# Patient Record
Sex: Male | Born: 1960 | Hispanic: No | Marital: Married | Smoking: Never smoker
Health system: Southern US, Community
[De-identification: ages and names within clinical notes are randomized; demographics above are authoritative.]

## PROBLEM LIST (undated history)

## (undated) DIAGNOSIS — C801 Malignant (primary) neoplasm, unspecified: Secondary | ICD-10-CM

## (undated) HISTORY — DX: Malignant (primary) neoplasm, unspecified: C80.1

## (undated) HISTORY — PX: OTHER SURGICAL HISTORY: SHX169

---

## 2011-12-02 ENCOUNTER — Other Ambulatory Visit: Payer: Self-pay | Admitting: Otolaryngology

## 2011-12-02 DIAGNOSIS — C310 Malignant neoplasm of maxillary sinus: Secondary | ICD-10-CM

## 2011-12-02 DIAGNOSIS — C050 Malignant neoplasm of hard palate: Secondary | ICD-10-CM

## 2011-12-04 ENCOUNTER — Ambulatory Visit
Admission: RE | Admit: 2011-12-04 | Discharge: 2011-12-04 | Disposition: A | Discharge status: Home / Self Care | Payer: Self-pay | Source: Ambulatory Visit | Attending: Otolaryngology | Admitting: Otolaryngology

## 2011-12-04 DIAGNOSIS — C050 Malignant neoplasm of hard palate: Secondary | ICD-10-CM

## 2011-12-04 DIAGNOSIS — C310 Malignant neoplasm of maxillary sinus: Secondary | ICD-10-CM

## 2011-12-04 MED ORDER — IOHEXOL 300 MG/ML  SOLN
75.0000 mL | Freq: Once | INTRAMUSCULAR | Status: AC | PRN
  Administered 2011-12-04: 75 mL via INTRAVENOUS

## 2014-05-18 ENCOUNTER — Encounter: Payer: Self-pay | Admitting: Vascular Surgery

## 2014-05-18 ENCOUNTER — Other Ambulatory Visit: Payer: Self-pay | Admitting: *Deleted

## 2014-05-18 ENCOUNTER — Ambulatory Visit (INDEPENDENT_AMBULATORY_CARE_PROVIDER_SITE_OTHER): Payer: Medicaid Other | Admitting: Vascular Surgery

## 2014-05-18 ENCOUNTER — Encounter (HOSPITAL_COMMUNITY): Payer: Medicaid Other

## 2014-05-18 ENCOUNTER — Ambulatory Visit (HOSPITAL_COMMUNITY)
Admission: RE | Admit: 2014-05-18 | Discharge: 2014-05-18 | Disposition: A | Discharge status: Home / Self Care | Payer: Medicaid Other | Source: Ambulatory Visit | Attending: Vascular Surgery | Admitting: Vascular Surgery

## 2014-05-18 VITALS — BP 131/90 | HR 79 | Ht 66.0 in | Wt 143.5 lb

## 2014-05-18 DIAGNOSIS — I779 Disorder of arteries and arterioles, unspecified: Secondary | ICD-10-CM

## 2014-05-18 DIAGNOSIS — M79676 Pain in unspecified toe(s): Secondary | ICD-10-CM | POA: Insufficient documentation

## 2014-05-18 DIAGNOSIS — I70209 Unspecified atherosclerosis of native arteries of extremities, unspecified extremity: Secondary | ICD-10-CM

## 2014-05-18 DIAGNOSIS — I872 Venous insufficiency (chronic) (peripheral): Secondary | ICD-10-CM

## 2014-05-18 NOTE — Progress Notes (Signed)
Referred by:  Harvie Junior, MD [place], [place] [id number]  Reason for referral: B toe ischemia  History of Present Illness  Vincent Heath is a 54 y.o. (December 11, 1960) male who presents with chief complaint: B toe pain with color change.  Pt notes ~1 month ago, onset of toe color change to purple and pain in his toes.  No obvious trigger.  Temperature has not been found to influence the color but elevating the foot resolves the color.  The patient has no history of intermittent claudication or rest pain.  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  History is obtained through a Optometrist.  The patient does not note any cardiac arrhythmia.   Medical History: oral cancer  Surgical History: orofacial surgery for oral cancer  History   Social History  . Marital Status: Married    Spouse Name: N/A    Number of Children: N/A  . Years of Education: N/A   Occupational History  . Not on file.   Social History Main Topics  . Smoking status: Never Smoker   . Smokeless tobacco: Current User    Types: Chew  . Alcohol Use: 0.0 oz/week    0 Not specified per week     Comment: occ  . Drug Use: No  . Sexual Activity: Not on file   Other Topics Concern  . Not on file   Social History Narrative  . No narrative on file    Family history: patient does not recall his parents' medical history   Current Outpatient Prescriptions  Medication Sig Dispense Refill  . Multiple Vitamins-Minerals (CENTRUM SILVER PO) Take 1 capsule by mouth daily.     No current facility-administered medications for this visit.     No Known Allergies   REVIEW OF SYSTEMS:  (Positives checked otherwise negative)  CARDIOVASCULAR:  []  chest pain, []  chest pressure, []  palpitations, []  shortness of breath when laying flat, []  shortness of breath with exertion,  []  pain in feet when walking, [x]  pain in feet when laying flat, []  history of blood  clot in veins (DVT), []  history of phlebitis, []  swelling in legs, []  varicose veins  PULMONARY:  []  productive cough, []  asthma, []  wheezing  NEUROLOGIC:  []  weakness in arms or legs, []  numbness in arms or legs, []  difficulty speaking or slurred speech, []  temporary loss of vision in one eye, []  dizziness  HEMATOLOGIC:  []  bleeding problems, []  problems with blood clotting too easily  MUSCULOSKEL:  []  joint pain, []  joint swelling  GASTROINTEST:  []  vomiting blood, []  blood in stool     GENITOURINARY:  []  burning with urination, []  blood in urine  PSYCHIATRIC:  []  history of major depression  INTEGUMENTARY:  []  rashes, []  ulcers  CONSTITUTIONAL:  []  fever, []  chills   For VQI Use Only  PRE-ADM LIVING: Home  AMB STATUS: Ambulatory  CAD Sx: None  PRIOR CHF: None  STRESS TEST: [ ]  No, [ ]  Normal, [ ]  + ischemia, [ ]  + MI, [ ]  Both   Physical Examination  Filed Vitals:   05/18/14 1117  BP: 131/90  Pulse: 79  Height: 5\' 6"  (1.676 m)  Weight: 143 lb 8 oz (65.091 kg)  SpO2: 100%    Body mass index is 23.17 kg/(m^2).  General: A&O x 3, WDWN,   Head: [place]/AT, obvious facial malformation  Ear/Nose/Throat: Hearing grossly intact, nares w/o erythema  or drainage, oropharynx w/o Erythema/Exudate, Mallampati score: 2   Eyes: PERRLA, EOMI  Neck: Supple, no nuchal rigidity, no palpable LAD  Pulmonary: Sym exp, good air movt, CTAB, no rales, rhonchi, & wheezing  Cardiac: RRR, Nl S1, S2, no Murmurs, rubs or gallops  Vascular: Vessel Right Left  Radial Palpable Palpable  Ulnar Palpable Palpable  Brachial Palpable Palpable  Carotid Palpable, without bruit Palpable, without bruit  Aorta Not palpable N/A  Femoral Palpable Palpable  Popliteal Not palpable Not palpable  PT  Palpable  Palpable  DP  Palpable  Palpable   Gastrointestinal: soft, NTND, -G/R, - HSM, - masses, - CVAT B  Musculoskeletal: M/S 5/5 throughout , Extremities without ischemic changes , cyanotic  toes with L 1st and 2nd toe and R 1st toe most cyanotic, no frank gangrene, no speckling   Neurologic: CN 2-12 intact , Pain and light touch intact in extremities , Motor exam as listed above  Psychiatric: Judgment intact, Mood & affect appropriate for pt's clinical situation  Dermatologic: See M/S exam for extremity exam, no rashes otherwise noted  Lymph : No Cervical, Axillary, or Inguinal lymphadenopathy    Non-Invasive Vascular Imaging  ABI (Date: 05/18/2014)  RLE: 1.31, PT: tri, DP: tri, toe waveforms greatly dampened  LLE: 1.27, PT: tri, DP: tri, toe waveforms greatly dampened  Outside Studies/Documentation 3 pages of outside documents were reviewed including: public health clinic chart.  Medical Decision Making  Vincent Heath is a 54 y.o. 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   Pt sx and exam are not consistent with Raynaud's.  His exam also is not c/w thromboembolism.  Realistically after a month, anticoagulation would be useless if this was in fact distal thromboembolism.  The history of cyanosis resolution with elevation is consistent with CVI.    I ordered a BLE venous reflux exam.  The patient end up leaving before we could speak further.  I suspect he is going to need bilateral angiography to full evaluate his disease.    Unfortunately, I'm not certain that anything is going to help in this case, as there is no surgical or endovascular therapy for digital artery occlusive disease.  Thank you for allowing [place] to participate in this patient's care.  Adele Barthel, MD [facility] [place] Office: [phone] Pager: [phone]  05/18/2014, 2:53 PM

## 2014-05-18 NOTE — Addendum Note (Signed)
Addended by: Mena Goes on: 05/18/2014 03:41 PM   Modules accepted: Orders

## 2014-05-23 ENCOUNTER — Ambulatory Visit (HOSPITAL_COMMUNITY)
Admission: RE | Admit: 2014-05-23 | Discharge: 2014-05-23 | Disposition: A | Discharge status: Home / Self Care | Payer: Medicaid Other | Source: Ambulatory Visit | Attending: Vascular Surgery | Admitting: Vascular Surgery

## 2014-05-23 ENCOUNTER — Encounter: Payer: Self-pay | Admitting: Vascular Surgery

## 2014-05-23 ENCOUNTER — Ambulatory Visit (INDEPENDENT_AMBULATORY_CARE_PROVIDER_SITE_OTHER): Payer: Medicaid Other | Admitting: Vascular Surgery

## 2014-05-23 ENCOUNTER — Encounter (HOSPITAL_COMMUNITY): Payer: Medicaid Other

## 2014-05-23 VITALS — BP 135/93 | HR 80 | Resp 14 | Ht 66.0 in | Wt 143.0 lb

## 2014-05-23 DIAGNOSIS — I70209 Unspecified atherosclerosis of native arteries of extremities, unspecified extremity: Secondary | ICD-10-CM | POA: Diagnosis not present

## 2014-05-23 DIAGNOSIS — I872 Venous insufficiency (chronic) (peripheral): Secondary | ICD-10-CM

## 2014-05-23 DIAGNOSIS — I75029 Atheroembolism of unspecified lower extremity: Secondary | ICD-10-CM

## 2014-05-23 DIAGNOSIS — I779 Disorder of arteries and arterioles, unspecified: Secondary | ICD-10-CM

## 2014-05-23 NOTE — Progress Notes (Signed)
    Established Critical Limb Ischemia Patient  History of Present Illness  Vincent Heath is a 54 y.o. (Sep 23, 1960) male who presents with chief complaint: blue toes with L foot pain.  The patient does not have rest pain.  He continues to have a burning sensation in his L foot with color changes dependent on position.  In dependent position, the toes are blue when elevated they are "red."  Pt returns today for his venous insufficiency duplex.  The patient's PMH, PSH, SH, FamHx, Med, and Allergies are unchanged from 05/18/13.  On ROS today: no rest pain, no claudication, neuropathic sx in L foot  Physical Examination  Filed Vitals:   05/23/14 1521  BP: 135/93  Pulse: 80  Resp: 14  Height: 5\' 6"  (1.676 m)  Weight: 143 lb (64.864 kg)   Body mass index is 23.09 kg/(m^2).  General: A&O x 3, WDWN  Eyes: PERRLA, EOMI  Pulmonary: Sym exp, good air movt, CTAB, no rales, rhonchi, & wheezing  Cardiac: RRR, Nl S1, S2, no Murmurs, rubs or gallops  Vascular: Vessel Right Left  Radial Palpable Palpable  Brachial Palpable **Palpable  Carotid Palpable, without bruit Palpable, without bruit  Aorta Not palpable N/A  Femoral Palpable Palpable  Popliteal Not palpable Not palpable  PT Palpable Palpable  DP Palpable Palpable   Gastrointestinal: soft, NTND, -G/R, - HSM, - masses, - CVAT B  Musculoskeletal: M/S 5/5 throughout , Extremities without ischemic changes except  Cyanotic toes (L 1-2 worst), strongly palpable dorsal arch pulse  Neurologic: Pain and light touch intact in extremities , Motor exam as listed above  Non-Invasive Vascular Imaging  BLE venous insufficiency duplex (05/23/2014)   No DVT or SVT B, no GSV reflux B, +CFV reflux bilaterally  Medical Decision Making  Vincent Heath is a 54 y.o. male who presents with: B digital artery occlusive disease, CVI (C2)  I doubt this pt's CVI accounts for his sx.   Unfortunately, he doesn't fit the classic presentation of  Raynaud or thromboembolism. As I have discussed previously, anticoagulation is unlikely to be beneficial >1 month from start of his sx. I would screen the aorta for an embolic source: CTA chest, CTA abd/pelvis with runoff. I am also ordering: BMP, CBC, RF, ANA to screen for rheumatolgic etiologies. If those are not diagnostic, BLE runoff may be needed to evaluate his B foot perfusion. The patient will be started on ASA 81 mg PO daily. I offered him low dose Norvasc but he declined as reportedly he normally has a normal BP. He will follow up in 2 weeks with these studies.  Thank you for allowing [place] to participate in this patient's care.  Adele Barthel, MD [facility] [place] Office: [phone] Pager: [phone]  05/23/2014, 4:58 PM

## 2014-05-24 NOTE — Addendum Note (Signed)
Addended by: Mena Goes on: 05/24/2014 01:54 PM   Modules accepted: Orders

## 2014-05-25 ENCOUNTER — Other Ambulatory Visit: Payer: Self-pay | Admitting: Vascular Surgery

## 2014-05-25 LAB — BUN: BUN: 11 mg/dL (ref 6–23)

## 2014-05-25 LAB — CREATININE, SERUM: Creat: 0.89 mg/dL (ref 0.50–1.35)

## 2014-05-31 ENCOUNTER — Inpatient Hospital Stay: Admission: RE | Admit: 2014-05-31 | Payer: Medicaid Other | Source: Ambulatory Visit

## 2014-05-31 ENCOUNTER — Other Ambulatory Visit: Payer: Medicaid Other

## 2014-06-07 ENCOUNTER — Encounter: Payer: Self-pay | Admitting: Vascular Surgery

## 2014-06-07 ENCOUNTER — Ambulatory Visit
Admission: RE | Admit: 2014-06-07 | Discharge: 2014-06-07 | Disposition: A | Discharge status: Home / Self Care | Payer: Medicaid Other | Source: Ambulatory Visit | Attending: Vascular Surgery | Admitting: Vascular Surgery

## 2014-06-07 DIAGNOSIS — I779 Disorder of arteries and arterioles, unspecified: Secondary | ICD-10-CM

## 2014-06-07 DIAGNOSIS — I872 Venous insufficiency (chronic) (peripheral): Secondary | ICD-10-CM

## 2014-06-07 DIAGNOSIS — I75029 Atheroembolism of unspecified lower extremity: Secondary | ICD-10-CM

## 2014-06-07 MED ORDER — IOPAMIDOL (ISOVUE-370) INJECTION 76%
165.0000 mL | Freq: Once | INTRAVENOUS | Status: AC | PRN
  Administered 2014-06-07: 165 mL via INTRAVENOUS

## 2014-06-08 ENCOUNTER — Ambulatory Visit (INDEPENDENT_AMBULATORY_CARE_PROVIDER_SITE_OTHER): Payer: Medicaid Other | Admitting: Vascular Surgery

## 2014-06-08 ENCOUNTER — Encounter: Payer: Self-pay | Admitting: Vascular Surgery

## 2014-06-08 ENCOUNTER — Other Ambulatory Visit: Payer: Self-pay | Admitting: *Deleted

## 2014-06-08 ENCOUNTER — Encounter: Payer: Self-pay | Admitting: *Deleted

## 2014-06-08 VITALS — BP 137/92 | HR 78 | Resp 16 | Ht 66.0 in | Wt 142.6 lb

## 2014-06-08 DIAGNOSIS — I779 Disorder of arteries and arterioles, unspecified: Secondary | ICD-10-CM

## 2014-06-08 DIAGNOSIS — I872 Venous insufficiency (chronic) (peripheral): Secondary | ICD-10-CM

## 2014-06-08 DIAGNOSIS — I70209 Unspecified atherosclerosis of native arteries of extremities, unspecified extremity: Secondary | ICD-10-CM

## 2014-06-08 DIAGNOSIS — I75029 Atheroembolism of unspecified lower extremity: Secondary | ICD-10-CM

## 2014-06-08 NOTE — Progress Notes (Addendum)
Established  Limb Ischemia Patient  History of Present Illness  Vincent Heath is a 54 y.o. (08/23/1960) male who presents with chief complaint: no change.  Pt's sx are improved and the skin color in his feet has improved.  He recently had his CTA chest/abd/pelvis with runoff.  He has started taking ASA.  He denies intermittent claudication or rest pain  The patient's PMH, PSH, SH, FamHx, Med, and Allergies are unchanged from 06/08/14.  On ROS today: no gangrene, no anesthesia or paraesthesia  Physical Examination  Filed Vitals:   06/08/14 1015  BP: 137/92  Pulse: 78  Resp: 16  Height: 5' 6"  (1.676 m)  Weight: 142 lb 9.6 oz (64.683 kg)   Body mass index is 23.03 kg/(m^2).  General: A&O x 3, WDWN  Eyes: PERRLA, EOMI  Pulmonary: Sym exp, good air movt, CTAB, no rales, rhonchi, & wheezing  Cardiac: RRR, Nl S1, S2, no Murmurs, rubs or gallops  Vascular: Vessel Right Left  Radial Palpable Palpable  Brachial Palpable **Palpable  Carotid Palpable, without bruit Palpable, without bruit  Aorta Not palpable N/A  Femoral Palpable Palpable  Popliteal Not palpable Not palpable  PT Palpable Palpable  DP Palpable Palpable   Gastrointestinal: soft, NTND, -G/R, - HSM, - masses, - CVAT B  Musculoskeletal: M/S 5/5 throughout , Extremities without ischemic changes  Except bilateral cyanotic toes: R toes have pink mixed with cyanosis today, L toes appear less cyanotic than previous, additionally there appears to be patches of pink in the toes, skin in L 2nd toes is peeling  Neurologic: Pain and light touch intact in extremities , Motor exam as listed above  CTA Chest/abd/pelvis with runoff (06/07/14)  No significant atherosclerotic disease in the chest, abdomen, pelvis or lower extremity arteries. No evidence for occlusive arterial disease. 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  Variant hepatic arterial anatomy as described.  No acute  abnormality in the chest, abdomen or pelvis.  Calcifications in the right upper lung are probably related to old granulomatous disease.  Based on my review of his CTA, he has minimal plaque and no obvious thrombus.  The heart can be clearly evaluated and no thrombus is evident.  There is intact 3 vessel runoff in both feet.  A dorsal arch vessel can been seen going in to the L foot but most of the vessels in both feet are smaller than the resolution possible for this CT scanner.   Medical Decision Making  Vincent Heath is a 54 y.o. male who presents with: possible resolving digital ischemia vs Raynaud's phenomena   Labs were NOT drawn as ordered, so will reorder CBC, ANA, RF, ESR to screen for a rheumatologic etiology for his findings.  Last item on his evaluation is B leg angiograms to evaluate the digital artery pattern.  I feel this is essential to avoid any future confusion in regards of the diagnosis in this patient.  The diagnostic yield is better while the patient still actively has disease present.  I discussed with the patient the nature of angiographic procedures, especially the limited patencies of any endovascular intervention.  The patient is aware of that the risks of an angiographic procedure include but are not limited to: bleeding, infection, access site complications, renal failure, embolization, rupture of vessel, dissection, possible need for emergent surgical intervention, possible need for surgical procedures to treat the patient's pathology, anaphylactic reaction to contrast, and stroke and death.    The patient  is aware of the risks and agrees to proceed.  He will be scheduled this coming week, likely 4 MAR 16.  Thank you for allowing [place] to participate in this patient's care.  Adele Barthel, MD [facility] Office: [phone] Pager: [phone]  06/08/2014, 11:46 AM

## 2014-06-13 MED ORDER — SODIUM CHLORIDE 0.9 % IV SOLN: INTRAVENOUS | Status: DC

## 2014-06-14 ENCOUNTER — Other Ambulatory Visit: Payer: Self-pay | Admitting: *Deleted

## 2014-06-14 ENCOUNTER — Encounter (HOSPITAL_COMMUNITY): Payer: Self-pay | Admitting: Vascular Surgery

## 2014-06-14 ENCOUNTER — Encounter (HOSPITAL_COMMUNITY)
Admission: RE | Disposition: A | Discharge status: Home / Self Care | Payer: Self-pay | Source: Ambulatory Visit | Attending: Vascular Surgery

## 2014-06-14 ENCOUNTER — Ambulatory Visit (HOSPITAL_COMMUNITY)
Admission: RE | Admit: 2014-06-14 | Discharge: 2014-06-14 | Disposition: A | Discharge status: Home / Self Care | Payer: Medicaid Other | Source: Ambulatory Visit | Attending: Vascular Surgery | Admitting: Vascular Surgery

## 2014-06-14 DIAGNOSIS — I70293 Other atherosclerosis of native arteries of extremities, bilateral legs: Secondary | ICD-10-CM | POA: Insufficient documentation

## 2014-06-14 DIAGNOSIS — I779 Disorder of arteries and arterioles, unspecified: Secondary | ICD-10-CM | POA: Diagnosis present

## 2014-06-14 DIAGNOSIS — I998 Other disorder of circulatory system: Secondary | ICD-10-CM | POA: Diagnosis not present

## 2014-06-14 DIAGNOSIS — I5189 Other ill-defined heart diseases: Secondary | ICD-10-CM

## 2014-06-14 HISTORY — PX: ABDOMINAL AORTAGRAM: SHX5454

## 2014-06-14 LAB — CBC
HEMATOCRIT: 48.6 % (ref 39.0–52.0)
HEMOGLOBIN: 17.4 g/dL — AB (ref 13.0–17.0)
MCH: 30.4 pg (ref 26.0–34.0)
MCHC: 35.8 g/dL (ref 30.0–36.0)
MCV: 85 fL (ref 78.0–100.0)
Platelets: 204 10*3/uL (ref 150–400)
RBC: 5.72 MIL/uL (ref 4.22–5.81)
RDW: 12.9 % (ref 11.5–15.5)
WBC: 5.8 10*3/uL (ref 4.0–10.5)

## 2014-06-14 LAB — BASIC METABOLIC PANEL
ANION GAP: 5 (ref 5–15)
BUN: 7 mg/dL (ref 6–23)
CHLORIDE: 100 mmol/L (ref 96–112)
CO2: 33 mmol/L — ABNORMAL HIGH (ref 19–32)
Calcium: 9.6 mg/dL (ref 8.4–10.5)
Creatinine, Ser: 1.16 mg/dL (ref 0.50–1.35)
GFR calc Af Amer: 80 mL/min — ABNORMAL LOW (ref >90)
GFR calc non Af Amer: 69 mL/min — ABNORMAL LOW (ref >90)
Glucose, Bld: 88 mg/dL (ref 70–99)
POTASSIUM: 4.1 mmol/L (ref 3.5–5.1)
Sodium: 138 mmol/L (ref 135–145)

## 2014-06-14 LAB — SEDIMENTATION RATE
Sed Rate: 2 mm/hr (ref 0–16)
Sed Rate: 3 mm/hr (ref 0–16)

## 2014-06-14 SURGERY — ABDOMINAL AORTAGRAM
Anesthesia: LOCAL

## 2014-06-14 MED ORDER — MIDAZOLAM HCL 2 MG/2ML IJ SOLN
INTRAMUSCULAR | Status: AC
  Filled 2014-06-14: qty 2

## 2014-06-14 MED ORDER — HEPARIN (PORCINE) IN NACL 2-0.9 UNIT/ML-% IJ SOLN
INTRAMUSCULAR | Status: AC
  Filled 2014-06-14: qty 1000

## 2014-06-14 MED ORDER — SODIUM CHLORIDE 0.9 % IV SOLN: 1.0000 mL/kg/h | INTRAVENOUS | Status: DC

## 2014-06-14 MED ORDER — ACETAMINOPHEN 325 MG PO TABS: 650.0000 mg | ORAL_TABLET | ORAL | Status: DC | PRN

## 2014-06-14 MED ORDER — FENTANYL CITRATE 0.05 MG/ML IJ SOLN
INTRAMUSCULAR | Status: AC
  Filled 2014-06-14: qty 2

## 2014-06-14 MED ORDER — LIDOCAINE HCL (PF) 1 % IJ SOLN
INTRAMUSCULAR | Status: AC
  Filled 2014-06-14: qty 30

## 2014-06-14 NOTE — Interval H&P Note (Signed)
[facility]  History and Physical Update  The patient was interviewed and re-examined.  The patient's previous History and Physical has been reviewed and is unchanged from my multiple consult notes.  There is no change in the plan of care: aortogram, bilateral leg runoff.  Adele Barthel, MD [facility] Office: [phone] Pager: [phone]  06/14/2014, 8:48 AM

## 2014-06-14 NOTE — H&P (View-Only) (Signed)
Established  Limb Ischemia Patient  History of Present Illness  Vincent Heath is a 54 y.o. (1960-09-08) male who presents with chief complaint: no change.  Pt's sx are improved and the skin color in his feet has improved.  He recently had his CTA chest/abd/pelvis with runoff.  He has started taking ASA.  He denies intermittent claudication or rest pain  The patient's PMH, PSH, SH, FamHx, Med, and Allergies are unchanged from 06/08/14.  On ROS today: no gangrene, no anesthesia or paraesthesia  Physical Examination  Filed Vitals:   06/08/14 1015  BP: 137/92  Pulse: 78  Resp: 16  Height: 5' 6"  (1.676 m)  Weight: 142 lb 9.6 oz (64.683 kg)   Body mass index is 23.03 kg/(m^2).  General: A&O x 3, WDWN  Eyes: PERRLA, EOMI  Pulmonary: Sym exp, good air movt, CTAB, no rales, rhonchi, & wheezing  Cardiac: RRR, Nl S1, S2, no Murmurs, rubs or gallops  Vascular: Vessel Right Left  Radial Palpable Palpable  Brachial Palpable **Palpable  Carotid Palpable, without bruit Palpable, without bruit  Aorta Not palpable N/A  Femoral Palpable Palpable  Popliteal Not palpable Not palpable  PT Palpable Palpable  DP Palpable Palpable   Gastrointestinal: soft, NTND, -G/R, - HSM, - masses, - CVAT B  Musculoskeletal: M/S 5/5 throughout , Extremities without ischemic changes  Except bilateral cyanotic toes: R toes have pink mixed with cyanosis today, L toes appear less cyanotic than previous, additionally there appears to be patches of pink in the toes, skin in L 2nd toes is peeling  Neurologic: Pain and light touch intact in extremities , Motor exam as listed above  CTA Chest/abd/pelvis with runoff (06/07/14)  No significant atherosclerotic disease in the chest, abdomen, pelvis or lower extremity arteries. No evidence for occlusive arterial disease. 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  Variant hepatic arterial anatomy as described.  No acute  abnormality in the chest, abdomen or pelvis.  Calcifications in the right upper lung are probably related to old granulomatous disease.  Based on my review of his CTA, he has minimal plaque and no obvious thrombus.  The heart can be clearly evaluated and no thrombus is evident.  There is intact 3 vessel runoff in both feet.  A dorsal arch vessel can been seen going in to the L foot but most of the vessels in both feet are smaller than the resolution possible for this CT scanner.   Medical Decision Making  Vincent Heath is a 54 y.o. male who presents with: possible resolving digital ischemia vs Raynaud's phenomena   Labs were NOT drawn as ordered, so will reorder CBC, ANA, RF, ESR to screen for a rheumatologic etiology for his findings.  Last item on his evaluation is B leg angiograms to evaluate the digital artery pattern.  I feel this is essential to avoid any future confusion in regards of the diagnosis in this patient.  The diagnostic yield is better while the patient still actively has disease present.  I discussed with the patient the nature of angiographic procedures, especially the limited patencies of any endovascular intervention.  The patient is aware of that the risks of an angiographic procedure include but are not limited to: bleeding, infection, access site complications, renal failure, embolization, rupture of vessel, dissection, possible need for emergent surgical intervention, possible need for surgical procedures to treat the patient's pathology, anaphylactic reaction to contrast, and stroke and death.    The patient  is aware of the risks and agrees to proceed.  He will be scheduled this coming week, likely 4 MAR 16.  Thank you for allowing [place] to participate in this patient's care.  Adele Barthel, MD [facility] [place] Office: [phone] Pager: [phone]  06/08/2014, 11:46 AM

## 2014-06-14 NOTE — Op Note (Addendum)
OPERATIVE NOTE   PROCEDURE: 1.  Right common femoral artery cannulation under ultrasound guidance 2.  Placement of catheter in aorta 3.  Aortogram 4.  Third order arterial selection 5.  Left leg runoff via catheter 6.  Right foot runoff via sheath  PRE-OPERATIVE DIAGNOSIS: bilateral toe digital ischemia  POST-OPERATIVE DIAGNOSIS: same as above   SURGEON: Adele Barthel, MD  ANESTHESIA: conscious sedation  ESTIMATED BLOOD LOSS: 50 cc  CONTRAST: 220 cc  FINDING(S):  Aorta: patent  Superior mesenteric artery: patent Celiac artery: patent   Right Left  RA patent patent  CIA patent patent  EIA patent patent  IIA patent patent  CFA patent patent  SFA  patent  PFA  patent  Pop  Patent  Trif  Patent  AT Distally patent Patent  Pero Distally patent Patent  PT Distally patent Patent  Foot Dorsalis pedis feeds dorsal arch with some digital arteries branching into foot Dorsalis pedis feeds dorsal arch without any obvious digital arteries filling   SPECIMEN(S):  none  INDICATIONS:   Vincent Heath is a 54 y.o. male who presents with bilateral foot ischemia.  The patient presents for: aortogram, bilateral leg runoff, and bilateral foot angiogram.  I discussed with the patient the nature of angiographic procedures, especially the limited patencies of any endovascular intervention.  The patient is aware of that the risks of an angiographic procedure include but are not limited to: bleeding, infection, access site complications, renal failure, embolization, rupture of vessel, dissection, possible need for emergent surgical intervention, possible need for surgical procedures to treat the patient's pathology, and stroke and death.  The patient is aware of the risks and agrees to proceed.  DESCRIPTION: After full informed consent was obtained from the patient, the patient was brought back to the angiography suite.  The patient was placed supine upon the angiography table and connected  to monitoring equipment.  The patient was then given conscious sedation, the amounts of which are documented in the patient's chart.  The patient was prepped and drape in the standard fashion for an angiographic procedure.  At this point, attention was turned to the right groin.  Under ultrasound guidance, the subcutaneous tissue surrounding the left common femoral artery was anesthesized with 1% lidocaine with epinephrine.  The artery was then cannulated with a micropuncture needle.  The microwire was advanced into the iliac arterial system.  The needle was exchanged for a microsheath, which was loaded into the common femoral artery over the wire.  The microwire was exchanged for a [address] wire which was advanced into the aorta.  The microsheath was then exchanged for a 5-Fr sheath which was loaded into the common femoral artery.  The Omniflush catheter was then loaded over the wire up to the level of L1.  The catheter was connected to the power injector circuit.  After de-airring and de-clotting the circuit, a power injector aortogram was completed.  The [facility] wire was replaced in the catheter, and using the [place] and Omniflush catheter, the left common iliac artery was selected.  The catheter and wire were advanced into the external iliac artery.  An automated runoff was completed but the flow was so slow that the dye began to washout before it reached the knee.  Using the Omniflush catheter and a Versacore wire, I selected the left superficial femoral artery.  I exchanged the catheter for a Sleek catheter which was placed in the distal popliteal artery.  I did multiple injections of the tibial artery.  This consistently demonstrated extremely slow blood flow.  The findings are as listed above.  The left foot was placed into lateral foot projection and an extended injection was completed.  I removed the catheter and the aspirated the right femoral sheath.  There was no clot present.  I connected the right  femoral sheath to the power injector.  The right foot was placed into lateral foot position and an extended injection was completed.  Similar findings were found.  The findings are listed above.  Of note, the right foot did not start filling with contrast until >45 sec into the injection, suggest cardiac function is compromised.  Based on these images, I would recommend cardiac work-up due to extremely slow blood flow in the setting of no arterial disease in the femoropopliteal or tibial arteries.    COMPLICATIONS: none  CONDITION: stable  Adele Barthel, MD [facility] [place] Office: [phone] Pager: [phone]  06/14/2014, 11:56 AM

## 2014-06-14 NOTE — Progress Notes (Signed)
Per Aaron Edelman ok to bring to cath lab with labs pending

## 2014-06-14 NOTE — Discharge Instructions (Signed)

## 2014-06-15 ENCOUNTER — Telehealth: Payer: Self-pay | Admitting: Vascular Surgery

## 2014-06-15 LAB — RHEUMATOID FACTOR: RHEUMATOID FACTOR: 9.3 [IU]/mL (ref 0.0–13.9)

## 2014-06-15 LAB — ANTINUCLEAR ANTIBODIES, IFA: ANA Ab, IFA: NEGATIVE

## 2014-06-15 NOTE — Telephone Encounter (Signed)
[facility] is scheduled for 07/13/2014- I sent a msg to our RN to see if that would be OK. dpm

## 2014-06-15 NOTE — Telephone Encounter (Signed)
-----   Message from Mena Goes, RN sent at 06/14/2014  2:45 PM EST ----- Regarding: Schedule   ----- Message -----    From: Conrad [place]ons[place], MD    Sent: 06/14/2014  12:04 PM      To: [facility] Charge Pool  Vincent Heath [id number] 09-13-1960  PROCEDURE: 1.  Right common femoral artery cannulation under ultrasound guidance 2.  Placement of catheter in aorta 3.  Aortogram 4.  Third order arterial selection 5.  Left leg runoff via catheter 6.  Right foot runoff via sheath  Follow-up: 4 weeks  Orders(s) for follow-up: ASAP Cardiology evaluation for poor leg perfusion likely due to poor cardiac function

## 2014-06-19 NOTE — Telephone Encounter (Signed)
Rip Harbour said that daughter confirmed appt with [facility] and is aware of Dr Debara Pickett appt, dpm

## 2014-06-19 NOTE — Telephone Encounter (Signed)
-----   Message from Rufina Falco sent at 06/19/2014  9:29 AM EST ----- Regarding: FYI The daughter of Mr. Toft was transferred to me.  I gave her the appointment info to Dr. Debara Pickett.   She confirmed the office follow up with Dr. Bridgett Larsson on 04/15.  Rip Harbour

## 2014-06-29 NOTE — Telephone Encounter (Signed)
Per Zigmund Daniel- try to move appointment up if possible.  Spoke with Elberta Fortis at [facility]- and at this time, he has no availability to move up at any location before 07/13/14. I will continue to try and r/s at another location prior to 04/01.

## 2014-07-13 ENCOUNTER — Ambulatory Visit: Payer: Medicaid Other | Admitting: Internal Medicine

## 2014-07-26 ENCOUNTER — Encounter: Payer: Self-pay | Admitting: Vascular Surgery

## 2014-07-27 ENCOUNTER — Encounter: Payer: Medicaid Other | Admitting: Vascular Surgery

## 2014-08-29 ENCOUNTER — Encounter: Payer: Self-pay | Admitting: Gastroenterology

## 2014-09-04 ENCOUNTER — Ambulatory Visit (INDEPENDENT_AMBULATORY_CARE_PROVIDER_SITE_OTHER): Payer: Medicaid Other | Admitting: Internal Medicine

## 2014-09-04 ENCOUNTER — Encounter: Payer: Self-pay | Admitting: Internal Medicine

## 2014-09-04 VITALS — BP 122/78 | HR 71 | Ht 66.0 in | Wt 140.8 lb

## 2014-09-04 DIAGNOSIS — I70209 Unspecified atherosclerosis of native arteries of extremities, unspecified extremity: Secondary | ICD-10-CM | POA: Diagnosis not present

## 2014-09-04 DIAGNOSIS — I739 Peripheral vascular disease, unspecified: Secondary | ICD-10-CM | POA: Diagnosis not present

## 2014-09-04 DIAGNOSIS — R0609 Other forms of dyspnea: Secondary | ICD-10-CM | POA: Diagnosis not present

## 2014-09-04 DIAGNOSIS — M79674 Pain in right toe(s): Secondary | ICD-10-CM | POA: Insufficient documentation

## 2014-09-04 DIAGNOSIS — I779 Disorder of arteries and arterioles, unspecified: Secondary | ICD-10-CM

## 2014-09-04 DIAGNOSIS — M79675 Pain in left toe(s): Secondary | ICD-10-CM

## 2014-09-04 NOTE — Patient Instructions (Signed)
Your physician has requested that you have an echocardiogram. Echocardiography is a painless test that uses sound waves to create images of your heart. It provides your doctor with information about the size and shape of your heart and how well your heart's chambers and valves are working. This procedure takes approximately one hour. There are no restrictions for this procedure.  Dr. Debara Pickett has ordered an arterial doppler - toe brachial index  Your physician recommends that you schedule a follow-up appointment after your tests.

## 2014-09-04 NOTE — Progress Notes (Signed)
OFFICE NOTE  Chief Complaint:  Toe pain  Primary Care Physician: Smothers, Vincent Elk, NP  HPI:  Vincent Heath is a pleasant 54 year old [place] male who is currently referred to me by Dr. Bridgett Heath for evaluation of "cardiac insufficiency". He recently presented Dr. Bridgett Heath for workup of toe pain in both feet. He was noted to have some digital ischemia. Lower extremity arterial Dopplers performed with TBI's which indicated poor flow to the toes. He underwent a CT angiogram which showed normal runoff and no atherosclerosis. This extended from the chest although a to the toes. Despite the normal findings he underwent traditional angiography which showed no significant obstruction but very slow flow to the toes which ended at midfoot. There is concern for a cardiac etiology of this. He underwent vascular testing including screening ANA, rheumatoid factor, sedimentation rate and other tests which were all negative. There is no family history of autoimmune disorder. There is no reported family history of coronary disease. Both parents died of "old age". He denies any chest pain and may get mildly short of breath with marked exertion. There is no history of hypertension, diabetes or known dyslipidemia. 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 He underwent surgery and radiation treatment at [facility] about 3 years ago and is cancer free.  He reports his toes hurt more when he is out in cold weather and turn abnormal colors and are painful and feel like they're burning. He says his symptoms are better now in the summer.  PMHx:  Past Medical History  Diagnosis Date  . Cancer     cheek    Past Surgical History  Procedure Laterality Date  . Cheek surgery    . Abdominal aortagram N/A 06/14/2014    Procedure: ABDOMINAL Vincent Heath;  Surgeon: Vincent [place], MD;  Location: [facility] CATH LAB;  Service: Cardiovascular;  Laterality: N/A;    FAMHx:  History reviewed.  No pertinent family history.  SOCHx:   reports that he has never smoked. His smokeless tobacco use includes Chew. He reports that he drinks alcohol. He reports that he does not use illicit drugs.  ALLERGIES:  No Known Allergies  ROS: A comprehensive review of systems was negative except for: Hematologic/lymphatic: positive for Painful toes  HOME MEDS: Current Outpatient Prescriptions  Medication Sig Dispense Refill  . Multiple Vitamins-Minerals (CENTRUM SILVER PO) Take 1 capsule by mouth daily.     No current facility-administered medications for this visit.    LABS/IMAGING: No results found for this or any previous visit (from the past 48 hour(s)). No results found.  WEIGHTS: Wt Readings from Last 3 Encounters:  09/04/14 140 lb 12.8 oz (63.866 kg)  06/14/14 145 lb (65.772 kg)  06/08/14 142 lb 9.6 oz (64.683 kg)    VITALS: BP 122/78 mmHg  Pulse 71  Ht 5\' 6"  (1.676 m)  Wt 140 lb 12.8 oz (63.866 kg)  BMI 22.74 kg/m2  EXAM: General appearance: alert and no distress Neck: no carotid bruit and no JVD Lungs: clear to auscultation bilaterally Heart: regular rate and rhythm, S1, S2 normal, no murmur, click, rub or gallop Abdomen: soft, non-tender; bowel sounds normal; no masses,  no organomegaly Extremities: extremities normal, atraumatic, no cyanosis or edema and Normal toe coloration, capillary refill is delayed greater than 3 seconds Pulses: 2+ and symmetric Skin: Skin color, texture, turgor normal. No rashes or lesions Neurologic: Grossly normal Psych: Pleasant  EKG: Normal sinus rhythm at  71  ASSESSMENT: 1. Digital arterial insufficiency with toe pain-suspect Raynaud's phenomenon 2. Mild dyspnea with exertion  PLAN: 1.   Vincent Heath has described toe discoloration, pain and coolness especially in cold weather. Workup including angiography, laboratory work for autoimmune disorders and a CT angiogram were all unrevealing. There is concern for "cardiac  insufficiency", however there are few symptoms to suggest heart failure on physical exam. It is possible he could have a compensated low output heart failure, but I would suspect it to be nonischemic based on the fact that there is virtually no coronary atherosclerosis sinus CT. I recommended echocardiogram to rule out a cardiomyopathy. We will also obtain TBI with a [place] challenge to evaluate for Raynaud's. If this is abnormal, I would recommend starting low-dose calcium channel blocker such as amlodipine which helped significantly with his symptoms.  Thanks for the kind referral.  Vincent Casino, MD, [facility] Attending Cardiologist [place] 09/04/2014, 8:47 AM

## 2014-09-07 ENCOUNTER — Encounter: Payer: Self-pay | Admitting: Internal Medicine

## 2014-09-07 ENCOUNTER — Telehealth: Payer: Self-pay | Admitting: Internal Medicine

## 2014-09-07 NOTE — Telephone Encounter (Signed)
Close encounter 

## 2014-09-09 ENCOUNTER — Emergency Department (HOSPITAL_COMMUNITY)
Admission: EM | Admit: 2014-09-09 | Discharge: 2014-09-09 | Disposition: A | Discharge status: Home / Self Care | Payer: Medicaid Other | Attending: Emergency Medicine | Admitting: Emergency Medicine

## 2014-09-09 ENCOUNTER — Encounter (HOSPITAL_COMMUNITY): Payer: Self-pay | Admitting: Family Medicine

## 2014-09-09 ENCOUNTER — Emergency Department (HOSPITAL_COMMUNITY): Payer: Medicaid Other

## 2014-09-09 DIAGNOSIS — J069 Acute upper respiratory infection, unspecified: Secondary | ICD-10-CM | POA: Diagnosis not present

## 2014-09-09 DIAGNOSIS — R040 Epistaxis: Secondary | ICD-10-CM | POA: Diagnosis not present

## 2014-09-09 DIAGNOSIS — H9209 Otalgia, unspecified ear: Secondary | ICD-10-CM | POA: Diagnosis not present

## 2014-09-09 DIAGNOSIS — Z85818 Personal history of malignant neoplasm of other sites of lip, oral cavity, and pharynx: Secondary | ICD-10-CM | POA: Diagnosis not present

## 2014-09-09 DIAGNOSIS — R05 Cough: Secondary | ICD-10-CM | POA: Diagnosis present

## 2014-09-09 LAB — I-STAT CHEM 8, ED
BUN: 7 mg/dL (ref 6–20)
Calcium, Ion: 1.2 mmol/L (ref 1.12–1.23)
Chloride: 100 mmol/L — ABNORMAL LOW (ref 101–111)
Creatinine, Ser: 0.9 mg/dL (ref 0.61–1.24)
Glucose, Bld: 81 mg/dL (ref 65–99)
HEMATOCRIT: 49 % (ref 39.0–52.0)
Hemoglobin: 16.7 g/dL (ref 13.0–17.0)
Potassium: 4.3 mmol/L (ref 3.5–5.1)
Sodium: 137 mmol/L (ref 135–145)
TCO2: 26 mmol/L (ref 0–100)

## 2014-09-09 MED ORDER — PHENYLEPHRINE HCL 0.5 % NA SOLN
1.0000 [drp] | Freq: Once | NASAL | Status: AC
  Administered 2014-09-09: 1 [drp] via NASAL
  Filled 2014-09-09: qty 15

## 2014-09-09 NOTE — ED Notes (Signed)
Returned from [employer]. Nose not bleeding at present.

## 2014-09-09 NOTE — ED Provider Notes (Signed)
CSN: [id number]     Arrival date & time 09/09/14  1112 History   First MD Initiated Contact with Patient 09/09/14 1159     Chief Complaint  Patient presents with  . URI  . Otalgia     (Consider location/radiation/quality/duration/timing/severity/associated sxs/prior Treatment) HPI Comments: Patient is a 54 year old male past medical history significant for cancer presenting to the emergency department for evaluation of right-sided epistaxis. Her daughter states that the patient has had intermittent bloody noses over the last 3 days. He is also had 3 days of nonproductive cough, rhinorrhea and congestion without fevers, chest pain or shortness of breath. The patient has not attempted to apply pressure use any means to stop the bleeding. He is not on any blood thinners. No modifying factors identified. Patient had left facial surgery to remove cancer, daughter states "they closed his left nostril."   Past Medical History  Diagnosis Date  . Cancer     cheek   Past Surgical History  Procedure Laterality Date  . Cheek surgery    . Abdominal aortagram N/A 06/14/2014    Procedure: ABDOMINAL Maxcine Ham;  Surgeon: Conrad Bethany, MD;  Location: [facility] CATH LAB;  Service: Cardiovascular;  Laterality: N/A;   History reviewed. No pertinent family history. History  Substance Use Topics  . Smoking status: Never Smoker   . Smokeless tobacco: Current User    Types: Chew  . Alcohol Use: 0.0 oz/week    0 Standard drinks or equivalent per week     Comment: occ    Review of Systems  HENT: Positive for congestion, nosebleeds and rhinorrhea.   Respiratory: Positive for cough.   All other systems reviewed and are negative.     Allergies  Review of patient's allergies indicates no known allergies.  Home Medications   Prior to Admission medications   Medication Sig Start Date End Date Taking? Authorizing Provider  acetaminophen (TYLENOL) 500 MG tablet Take 500 mg by mouth every 6 (six) hours as  needed (pain).   Yes Historical Provider, MD   BP 123/86 mmHg  Pulse 71  Temp(Src) 97.7 F (36.5 C) (Axillary)  Resp 18  SpO2 100% Physical Exam  Constitutional: He is oriented to person, place, and time. He appears well-developed and well-nourished. No distress.  HENT:  Head: Normocephalic and atraumatic.  Right Ear: External ear normal.  Left Ear: External ear normal.  Nose: Rhinorrhea present. No nasal septal hematoma.  Mouth/Throat: Oropharynx is clear and moist.  Right nostril with dried blood.   Eyes: Conjunctivae are normal.  Neck: Normal range of motion. Neck supple.  No nuchal rigidity.   Cardiovascular: Normal rate, regular rhythm and normal heart sounds.   Pulmonary/Chest: Effort normal and breath sounds normal. No respiratory distress.  Abdominal: Soft.  Musculoskeletal: Normal range of motion.  Neurological: He is alert and oriented to person, place, and time.  Skin: Skin is warm and dry. He is not diaphoretic.  Psychiatric: He has a normal mood and affect.  Nursing note and vitals reviewed.   ED Course  Procedures (including critical care time) Medications  phenylephrine (NEO-SYNEPHRINE) 0.5 % nasal solution 1 drop (1 drop Each Nare Given 09/09/14 1439)    Labs Review Labs Reviewed  I-STAT CHEM 8, ED - Abnormal; Notable for the following:    Chloride 100 (*)    All other components within normal limits    Imaging Review Dg Chest 2 View  09/09/2014   CLINICAL DATA:  Upper respiratory infection and cough.  EXAM:  CHEST - 2 VIEW  COMPARISON:  CTA of the chest on 06/07/2014  FINDINGS: The heart size and mediastinal contours are within normal limits. Calcified granuloma of the right lung apex noted. This was seen by prior CT. There is no evidence of pulmonary edema, consolidation, pneumothorax, nodule or pleural fluid. The visualized skeletal structures are unremarkable.  IMPRESSION: No active disease.   Electronically Signed   By: Aletta Edouard M.D.   On:  09/09/2014 13:48     EKG Interpretation None      MDM   Final diagnoses:  URI (upper respiratory infection)  Epistaxis    Filed Vitals:   09/09/14 1400  BP: 123/86  Pulse: 71  Temp:   Resp: 18   Afebrile, NAD, non-toxic appearing, AAOx4.  I have reviewed nursing notes, vital signs, and all appropriate lab and imaging results if ordered as above.  Patient with no active epistaxis on evaluation. Dried blood noted in right nostril. Congestion, rhinorrhea noted. Lungs clear to auscultation bilaterally. Pt CXR negative for acute infiltrate. Patients symptoms are consistent with URI, likely viral etiology. Discussed that antibiotics are not indicated for viral infections. Prescribe Afrin spray with advised ENT follow-up. Pt will be discharged with symptomatic treatment.  Verbalizes understanding and is agreeable with plan. Pt is hemodynamically stable & in NAD prior to dc.     Baron Sane, PA-C 09/09/14 1516  Charlesetta Shanks, MD 09/09/14 1622

## 2014-09-09 NOTE — ED Notes (Addendum)
Pt here for cough, congestion and mucous in nose. sts sometimes when he blows his nose its blood tinged. sts some left ear pain radiating into left cheek and jaw area. Hx of cancer

## 2014-09-09 NOTE — ED Notes (Signed)
Daughter at bedside-- interpreting for pt. States having pain swallowing.

## 2014-09-09 NOTE — Discharge Instructions (Signed)
Please follow up with your primary care physician in 1-2 days. If you do not have one please call the [place] number listed above. Please follow up with your ENT doctor to schedule a follow up appointment.  Please read all discharge instructions and return precautions.   Nosebleed Nosebleeds can be caused by many conditions, including trauma, infections, polyps, foreign bodies, dry mucous membranes or climate, medicines, and air conditioning. Most nosebleeds occur in the front of the nose. Because of this location, most nosebleeds can be controlled by pinching the nostrils gently and continuously for at least 10 to 20 minutes. The long, continuous pressure allows enough time for the blood to clot. If pressure is released during that 10 to 20 minute time period, the process may have to be started again. The nosebleed may stop by itself or quit with pressure, or it may need concentrated heating (cautery) or pressure from packing. HOME CARE INSTRUCTIONS   If your nose was packed, try to maintain the pack inside until your health care provider removes it. If a gauze pack was used and it starts to fall out, gently replace it or cut the end off. Do not cut if a balloon catheter was used to pack the nose. Otherwise, do not remove unless instructed.  Avoid blowing your nose for 12 hours after treatment. This could dislodge the pack or clot and start the bleeding again.  If the bleeding starts again, sit up and bend forward, gently pinching the front half of your nose continuously for 20 minutes.  If bleeding was caused by dry mucous membranes, use over-the-counter saline nasal spray or gel. This will keep the mucous membranes moist and allow them to heal. If you must use a lubricant, choose the water-soluble variety. Use it only sparingly and not within several hours of lying down.  Do not use petroleum jelly or mineral oil, as these may drip into the lungs and cause serious  problems.  Maintain humidity in your home by using less air conditioning or by using a humidifier.  Do not use aspirin or medicines which make bleeding more likely. Your health care provider can give you recommendations on this.  Resume normal activities as you are able, but try to avoid straining, lifting, or bending at the waist for several days.  If the nosebleeds become recurrent and the cause is unknown, your health care provider may suggest laboratory tests. SEEK MEDICAL CARE IF: You have a fever. SEEK IMMEDIATE MEDICAL CARE IF:   Bleeding recurs and cannot be controlled.  There is unusual bleeding from or bruising on other parts of the body.  Nosebleeds continue.  There is any worsening of the condition which originally brought you in.  You become light-headed, feel faint, become sweaty, or vomit blood. MAKE SURE YOU:   Understand these instructions.  Will watch your condition.  Will get help right away if you are not doing well or get worse. Document Released: 01/07/2005 Document Revised: 08/14/2013 Document Reviewed: 02/28/2009 [facility] Patient Information 2015 [place], [place]. This information is not intended to replace advice given to you by your health care provider. Make sure you discuss any questions you have with your health care provider. Upper Respiratory Infection, Adult An upper respiratory infection (URI) is also sometimes known as the common cold. The upper respiratory tract includes the nose, sinuses, throat, trachea, and bronchi. Bronchi are the airways leading to the lungs. Most people improve within 1 week, but symptoms can last up to  2 weeks. A residual cough may last even longer.  CAUSES Many different viruses can infect the tissues lining the upper respiratory tract. The tissues become irritated and inflamed and often become very moist. Mucus production is also common. A cold is contagious. You can easily spread the virus to others by oral contact. This  includes kissing, sharing a glass, coughing, or sneezing. Touching your mouth or nose and then touching a surface, which is then touched by another person, can also spread the virus. SYMPTOMS  Symptoms typically develop 1 to 3 days after you come in contact with a cold virus. Symptoms vary from person to person. They may include:  Runny nose.  Sneezing.  Nasal congestion.  Sinus irritation.  Sore throat.  Loss of voice (laryngitis).  Cough.  Fatigue.  Muscle aches.  Loss of appetite.  Headache.  Low-grade fever. DIAGNOSIS  You might diagnose your own cold based on familiar symptoms, since most people get a cold 2 to 3 times a year. Your caregiver can confirm this based on your exam. Most importantly, your caregiver can check that your symptoms are not due to another disease such as strep throat, sinusitis, pneumonia, asthma, or epiglottitis. Blood tests, throat tests, and X-rays are not necessary to diagnose a common cold, but they may sometimes be helpful in excluding other more serious diseases. Your caregiver will decide if any further tests are required. RISKS AND COMPLICATIONS  You may be at risk for a more severe case of the common cold if you smoke cigarettes, have chronic heart disease (such as heart failure) or lung disease (such as asthma), or if you have a weakened immune system. The very young and very old are also at risk for more serious infections. Bacterial sinusitis, middle ear infections, and bacterial pneumonia can complicate the common cold. The common cold can worsen asthma and chronic obstructive pulmonary disease (COPD). Sometimes, these complications can require emergency medical care and may be life-threatening. PREVENTION  The best way to protect against getting a cold is to practice good hygiene. Avoid oral or hand contact with people with cold symptoms. Wash your hands often if contact occurs. There is no clear evidence that vitamin C, vitamin E, echinacea,  or exercise reduces the chance of developing a cold. However, it is always recommended to get plenty of rest and practice good nutrition. TREATMENT  Treatment is directed at relieving symptoms. There is no cure. Antibiotics are not effective, because the infection is caused by a virus, not by bacteria. Treatment may include:  Increased fluid intake. Sports drinks offer valuable electrolytes, sugars, and fluids.  Breathing heated mist or steam (vaporizer or shower).  Eating chicken soup or other clear broths, and maintaining good nutrition.  Getting plenty of rest.  Using gargles or lozenges for comfort.  Controlling fevers with ibuprofen or acetaminophen as directed by your caregiver.  Increasing usage of your inhaler if you have asthma. Zinc gel and zinc lozenges, taken in the first 24 hours of the common cold, can shorten the duration and lessen the severity of symptoms. Pain medicines may help with fever, muscle aches, and throat pain. A variety of non-prescription medicines are available to treat congestion and runny nose. Your caregiver can make recommendations and may suggest nasal or lung inhalers for other symptoms.  HOME CARE INSTRUCTIONS   Only take over-the-counter or prescription medicines for pain, discomfort, or fever as directed by your caregiver.  Use a warm mist humidifier or inhale steam from a shower to  increase air moisture. This may keep secretions moist and make it easier to breathe.  Drink enough water and fluids to keep your urine clear or pale yellow.  Rest as needed.  Return to work when your temperature has returned to normal or as your caregiver advises. You may need to stay home longer to avoid infecting others. You can also use a face mask and careful hand washing to prevent spread of the virus. SEEK MEDICAL CARE IF:   After the first few days, you feel you are getting worse rather than better.  You need your caregiver's advice about medicines to control  symptoms.  You develop chills, worsening shortness of breath, or brown or red sputum. These may be signs of pneumonia.  You develop yellow or brown nasal discharge or pain in the face, especially when you bend forward. These may be signs of sinusitis.  You develop a fever, swollen neck glands, pain with swallowing, or white areas in the back of your throat. These may be signs of strep throat. SEEK IMMEDIATE MEDICAL CARE IF:   You have a fever.  You develop severe or persistent headache, ear pain, sinus pain, or chest pain.  You develop wheezing, a prolonged cough, cough up blood, or have a change in your usual mucus (if you have chronic lung disease).  You develop sore muscles or a stiff neck. Document Released: 09/23/2000 Document Revised: 06/22/2011 Document Reviewed: 07/05/2013 [facility] Patient Information 2015 [place], [place]. This information is not intended to replace advice given to you by your health care provider. Make sure you discuss any questions you have with your health care provider.

## 2014-09-09 NOTE — ED Notes (Addendum)
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 C/o left ear pain -- same side surgery was done on at [facility]. States left nares "does not work"

## 2014-09-11 ENCOUNTER — Ambulatory Visit (HOSPITAL_BASED_OUTPATIENT_CLINIC_OR_DEPARTMENT_OTHER)
Admission: RE | Admit: 2014-09-11 | Discharge: 2014-09-11 | Disposition: A | Discharge status: Home / Self Care | Payer: Medicaid Other | Source: Ambulatory Visit | Attending: Internal Medicine | Admitting: Internal Medicine

## 2014-09-11 ENCOUNTER — Ambulatory Visit (HOSPITAL_COMMUNITY)
Admission: RE | Admit: 2014-09-11 | Discharge: 2014-09-11 | Disposition: A | Discharge status: Home / Self Care | Payer: Medicaid Other | Source: Ambulatory Visit | Attending: Cardiology | Admitting: Cardiology

## 2014-09-11 DIAGNOSIS — I739 Peripheral vascular disease, unspecified: Secondary | ICD-10-CM

## 2014-09-14 ENCOUNTER — Telehealth: Payer: Self-pay | Admitting: Internal Medicine

## 2014-09-14 NOTE — Telephone Encounter (Signed)
Pt called in stating that he was recently in the hospital and was prescribed Neo-Synephrine . He says that he is almost out and would like to refilled at the [facility] on [address] and [address]. Please call  Thanks

## 2014-09-14 NOTE — Telephone Encounter (Signed)
Spoke to daughter - she informed me patient was out of sinus/congestion med filled in ED. Advised would need to be refilled by PCP - she voiced understanding.

## 2014-09-20 ENCOUNTER — Other Ambulatory Visit: Payer: Self-pay | Admitting: *Deleted

## 2014-09-20 MED ORDER — AMLODIPINE BESYLATE 2.5 MG PO TABS: 2.5000 mg | ORAL_TABLET | Freq: Every day | ORAL | Status: DC

## 2014-09-20 NOTE — Telephone Encounter (Signed)
Rx(s) sent to pharmacy electronically per ABI result note

## 2014-10-24 ENCOUNTER — Ambulatory Visit: Payer: Medicaid Other | Admitting: Internal Medicine

## 2014-10-25 ENCOUNTER — Ambulatory Visit (AMBULATORY_SURGERY_CENTER): Payer: Self-pay

## 2014-10-25 VITALS — Ht 66.0 in | Wt 139.0 lb

## 2014-10-25 DIAGNOSIS — Z1211 Encounter for screening for malignant neoplasm of colon: Secondary | ICD-10-CM

## 2014-10-25 MED ORDER — SUPREP BOWEL PREP KIT 17.5-3.13-1.6 GM/177ML PO SOLN: 1.0000 | Freq: Once | ORAL | Status: DC

## 2014-10-25 NOTE — Progress Notes (Signed)
No allergies to eggs or soy No diet/weight loss meds No home oxygen No past problems with anesthesia  No email  Not registered for emmi instructions

## 2014-11-05 ENCOUNTER — Encounter: Payer: Self-pay | Admitting: Internal Medicine

## 2014-11-05 ENCOUNTER — Ambulatory Visit (INDEPENDENT_AMBULATORY_CARE_PROVIDER_SITE_OTHER): Payer: Medicaid Other | Admitting: Internal Medicine

## 2014-11-05 VITALS — BP 122/84 | HR 92 | Ht 66.0 in | Wt 138.6 lb

## 2014-11-05 DIAGNOSIS — I779 Disorder of arteries and arterioles, unspecified: Secondary | ICD-10-CM

## 2014-11-05 DIAGNOSIS — M79675 Pain in left toe(s): Secondary | ICD-10-CM | POA: Diagnosis not present

## 2014-11-05 DIAGNOSIS — I7300 Raynaud's syndrome without gangrene: Secondary | ICD-10-CM

## 2014-11-05 DIAGNOSIS — I70209 Unspecified atherosclerosis of native arteries of extremities, unspecified extremity: Secondary | ICD-10-CM

## 2014-11-05 DIAGNOSIS — M79674 Pain in right toe(s): Secondary | ICD-10-CM | POA: Diagnosis not present

## 2014-11-05 NOTE — Patient Instructions (Signed)
Your physician recommends that you schedule a follow-up appointment as needed.   Please let us know if your symptoms worsen - or contact your primary care provider.

## 2014-11-05 NOTE — Progress Notes (Signed)
OFFICE NOTE  Chief Complaint:  Toe pain, follow-up doppleres  Primary Care Physician: Smothers, Andree Elk, NP  HPI:  Vincent Heath is a pleasant 54 year old [place] male who is currently referred to me by Dr. Bridgett Larsson for evaluation of "cardiac insufficiency". He recently presented Dr. Bridgett Larsson for workup of toe pain in both feet. He was noted to have some digital ischemia. Lower extremity arterial Dopplers performed with TBI's which indicated poor flow to the toes. He underwent a CT angiogram which showed normal runoff and no atherosclerosis. This extended from the chest although a to the toes. Despite the normal findings he underwent traditional angiography which showed no significant obstruction but very slow flow to the toes which ended at midfoot. There is concern for a cardiac etiology of this. He underwent vascular testing including screening ANA, rheumatoid factor, sedimentation rate and other tests which were all negative. There is no family history of autoimmune disorder. There is no reported family history of coronary disease. Both parents died of "old age". He denies any chest pain and may get mildly short of breath with marked exertion. There is no history of hypertension, diabetes or known dyslipidemia. 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 He underwent surgery and radiation treatment at [facility] about 3 years ago and is cancer free.  He reports his toes hurt more when he is out in cold weather and turn abnormal colors and are painful and feel like they're burning. He says his symptoms are better now in the summer.  Vincent Heath returns today for follow-up of peripheral Dopplers including TBI's which indicated an abnormal waveform after cold challenge, therefore confirming the diagnosis of Raynaud's phenomenon. I recommended starting him on low-dose amlodipine 2.5 mg daily which she seems to be taking without difficulty. Of course the  weather is fairly warm now and he is generally asymptomatic. He will need to observe whether or not he has any recurrent symptoms when the weather gets colder and may benefit from an increased dose up to 5 mg.  PMHx:  Past Medical History  Diagnosis Date  . Cancer     cheek    Past Surgical History  Procedure Laterality Date  . Cheek surgery    . Abdominal aortagram N/A 06/14/2014    Procedure: ABDOMINAL Maxcine Ham;  Surgeon: Conrad North Sea, MD;  Location: [facility] CATH LAB;  Service: Cardiovascular;  Laterality: N/A;    FAMHx:  Family History  Problem Relation Age of Onset  . Colon cancer Neg Hx     SOCHx:   reports that he has never smoked. His smokeless tobacco use includes Chew. He reports that he drinks alcohol. He reports that he does not use illicit drugs.  ALLERGIES:  No Known Allergies  ROS: A comprehensive review of systems was negative.  HOME MEDS: Current Outpatient Prescriptions  Medication Sig Dispense Refill  . amLODipine (NORVASC) 2.5 MG tablet Take 1 tablet (2.5 mg total) by mouth daily. 30 tablet 6   No current facility-administered medications for this visit.    LABS/IMAGING: No results found for this or any previous visit (from the past 48 hour(s)). No results found.  WEIGHTS: Wt Readings from Last 3 Encounters:  11/05/14 138 lb 9.6 oz (62.869 kg)  10/25/14 139 lb (63.05 kg)  09/04/14 140 lb 12.8 oz (63.866 kg)    VITALS: BP 122/84 mmHg  Pulse 92  Ht 5\' 6"  (1.676 m)  Wt 138 lb 9.6  oz (62.869 kg)  BMI 22.38 kg/m2  EXAM: deferred  EKG: deferred  ASSESSMENT: 1. Digital arterial insufficiency with toe pain- dopplers confirmed Raynaud's phenomenon 2. Mild dyspnea with exertion  PLAN: 1.   Mr. Gantt does indeed have Raynaud's phenomenon. This should be improved with a calcium channel blocker. He's currently on low-dose amlodipine. His symptoms will likely be worse in cold weather and if he is not improved on low-dose amlodipine, one may consider  increasing it up to 5 mg.  I can see him back on an as-needed basis. Thanks again for the kind referral.  Pixie Casino, MD, [facility] Attending Cardiologist [place] 11/05/2014, 1:48 PM

## 2014-11-08 ENCOUNTER — Encounter: Payer: Self-pay | Admitting: Gastroenterology

## 2014-11-08 ENCOUNTER — Ambulatory Visit (AMBULATORY_SURGERY_CENTER): Payer: Medicaid Other | Admitting: Gastroenterology

## 2014-11-08 VITALS — BP 110/72 | HR 68 | Temp 98.6°F | Resp 20 | Ht 66.0 in | Wt 137.0 lb

## 2014-11-08 DIAGNOSIS — Z1211 Encounter for screening for malignant neoplasm of colon: Secondary | ICD-10-CM

## 2014-11-08 MED ORDER — SODIUM CHLORIDE 0.9 % IV SOLN: 500.0000 mL | INTRAVENOUS | Status: DC

## 2014-11-08 NOTE — Patient Instructions (Signed)
YOU HAD AN ENDOSCOPIC PROCEDURE TODAY AT [facility]:   Refer to the procedure report that was given to you for any specific questions about what was found during the examination.  If the procedure report does not answer your questions, please call your gastroenterologist to clarify.  If you requested that your care partner not be given the details of your procedure findings, then the procedure report has been included in a sealed envelope for you to review at your convenience later.  YOU SHOULD EXPECT: Some feelings of bloating in the abdomen. Passage of more gas than usual.  Walking can help get rid of the air that was put into your GI tract during the procedure and reduce the bloating. If you had a lower endoscopy (such as a colonoscopy or flexible sigmoidoscopy) you may notice spotting of blood in your stool or on the toilet paper. If you underwent a bowel prep for your procedure, you may not have a normal bowel movement for a few days.  Please Note:  You might notice some irritation and congestion in your nose or some drainage.  This is from the oxygen used during your procedure.  There is no need for concern and it should clear up in a day or so.  SYMPTOMS TO REPORT IMMEDIATELY:   Following lower endoscopy (colonoscopy or flexible sigmoidoscopy):  Excessive amounts of blood in the stool  Significant tenderness or worsening of abdominal pains  Swelling of the abdomen that is new, acute  Fever of 100F or higher   For urgent or emergent issues, a gastroenterologist can be reached at any hour by calling [phone].   DIET: Your first meal following the procedure should be a small meal and then it is ok to progress to your normal diet. Heavy or fried foods are harder to digest and may make you feel nauseous or bloated.  Likewise, meals heavy in dairy and vegetables can increase bloating.  Drink plenty of fluids but you should avoid alcoholic beverages for 24  hours.  ACTIVITY:  You should plan to take it easy for the rest of today and you should NOT DRIVE or use heavy machinery until tomorrow (because of the sedation medicines used during the test).    FOLLOW UP: Our staff will call the number listed on your records the next business day following your procedure to check on you and address any questions or concerns that you may have regarding the information given to you following your procedure. If we do not reach you, we will leave a message.  However, if you are feeling well and you are not experiencing any problems, there is no need to return our call.  We will assume that you have returned to your regular daily activities without incident.  If any biopsies were taken you will be contacted by phone or by letter within the next 1-3 weeks.  Please call us at [phone] if you have not heard about the biopsies in 3 weeks.    SIGNATURES/CONFIDENTIALITY: You and/or your care partner have signed paperwork which will be entered into your electronic medical record.  These signatures attest to the fact that that the information above on your After Visit Summary has been reviewed and is understood.  Full responsibility of the confidentiality of this discharge information lies with you and/or your care-partner.     You may resume your current medications today. Please call if any questions or concerns.

## 2014-11-08 NOTE — Progress Notes (Signed)
Transferred to recovery room. A/O x3, pleased with MAC.  VSS.  Report to Annette, RN. 

## 2014-11-08 NOTE — Op Note (Signed)
[place]  [address] [place], [id number]   COLONOSCOPY PROCEDURE REPORT  PATIENT: Vincent Heath, Vincent Heath  MR#: [id number] BIRTHDATE: 11/23/1960 , 71  yrs. old GENDER: male ENDOSCOPIST: Inda Castle, MD REFERRED BY: PROCEDURE DATE:  11/08/2014 PROCEDURE:   Colonoscopy, screening First Screening Colonoscopy - Avg.  risk and is 50 yrs.  old or older Yes.  Prior Negative Screening - Now for repeat screening. N/A  History of Adenoma - Now for follow-up colonoscopy & has been > or = to 3 yrs.  N/A  Polyps removed today? No Recommend repeat exam, <10 yrs? No ASA CLASS:   Class II INDICATIONS:Colorectal Neoplasm Risk Assessment for this procedure is average risk. MEDICATIONS: Monitored anesthesia care and Propofol 175 mg IV  DESCRIPTION OF PROCEDURE:   After the risks benefits and alternatives of the procedure were thoroughly explained, informed consent was obtained.  The digital rectal exam revealed no abnormalities of the rectum.   The LB [id number]  endoscope was introduced through the anus and advanced to the terminal ileum which was intubated for a short distance. No adverse events experienced.   The quality of the prep was (Suprep was used) excellent.  The instrument was then slowly withdrawn as the colon was fully examined. Estimated blood loss is zero unless otherwise noted in this procedure report.      COLON FINDINGS: A normal appearing cecum, ileocecal valve, and appendiceal orifice were identified.  The ascending, transverse, descending, sigmoid colon, and rectum appeared unremarkable. Retroflexed views revealed no abnormalities. The time to cecum = 9:10 Withdrawal time =      The scope was withdrawn and the procedure completed. COMPLICATIONS: There were no immediate complications.  ENDOSCOPIC IMPRESSION: Normal colonoscopy  RECOMMENDATIONS: Continue current colorectal screening recommendations for "routine risk" patients with a repeat  colonoscopy in 10 years.  eSigned:  Inda Castle, MD 11/08/2014 2:29 PM   cc: Augusto Gamble, MD

## 2014-11-08 NOTE — Progress Notes (Signed)
No problems noted in the recovery room. maw 

## 2014-11-08 NOTE — Progress Notes (Signed)
Notified Dr. Deatra Ina & Crna that pt ate tortillas at 9PM LAST NIGHT AND EGG OMELET & 2 PIECES OF BREAD THIS AM AND 9;30 AM TOOK MEDICATION and 16 oz liquid including apple juice

## 2014-11-09 ENCOUNTER — Telehealth: Payer: Self-pay | Admitting: *Deleted

## 2014-11-09 NOTE — Telephone Encounter (Signed)
  Follow up Call-  Call back number 11/08/2014  Post procedure Call Back phone  # [phone]- daughters number  Permission to leave phone message Yes     Patient questions:  Do you have a fever, pain , or abdominal swelling? No. Pain Score  0 *  Have you tolerated food without any problems? Yes.    Have you been able to return to your normal activities? Yes.    Do you have any questions about your discharge instructions: Diet   No. Medications  No. Follow up visit  No.  Do you have questions or concerns about your Care? No.  Actions: * If pain score is 4 or above: No action needed, pain <4.

## 2015-03-14 DIAGNOSIS — Z0279 Encounter for issue of other medical certificate: Secondary | ICD-10-CM | POA: Diagnosis not present

## 2015-04-02 ENCOUNTER — Other Ambulatory Visit: Payer: Self-pay | Admitting: Internal Medicine

## 2015-04-02 MED ORDER — AMLODIPINE BESYLATE 2.5 MG PO TABS: 2.5000 mg | ORAL_TABLET | Freq: Every day | ORAL | Status: AC

## 2015-04-02 NOTE — Telephone Encounter (Signed)
°*  STAT* If patient is at the pharmacy, call can be transferred to refill team.   1. Which medications need to be refilled? (please list name of each medication and dose if known) Amolodipine  2. Which pharmacy/location (including street and city if local pharmacy) is medication to be sent to?[facility] on [address]   3. Do they need a 30 day or 90 day supply? [place]

## 2015-04-02 NOTE — Telephone Encounter (Signed)
Amlodipine refilled #90 w/no refilled electronically.

## 2015-05-14 ENCOUNTER — Ambulatory Visit: Payer: PRIVATE HEALTH INSURANCE | Admitting: Internal Medicine

## 2015-09-09 ENCOUNTER — Emergency Department (HOSPITAL_COMMUNITY)
Admission: EM | Admit: 2015-09-09 | Discharge: 2015-09-09 | Disposition: A | Discharge status: Home / Self Care | Payer: Medicaid Other | Attending: Emergency Medicine | Admitting: Emergency Medicine

## 2015-09-09 ENCOUNTER — Emergency Department (HOSPITAL_COMMUNITY): Payer: Medicaid Other

## 2015-09-09 ENCOUNTER — Encounter (HOSPITAL_COMMUNITY): Payer: Self-pay | Admitting: Emergency Medicine

## 2015-09-09 DIAGNOSIS — Z85828 Personal history of other malignant neoplasm of skin: Secondary | ICD-10-CM | POA: Diagnosis not present

## 2015-09-09 DIAGNOSIS — H9201 Otalgia, right ear: Secondary | ICD-10-CM | POA: Diagnosis present

## 2015-09-09 DIAGNOSIS — M542 Cervicalgia: Secondary | ICD-10-CM | POA: Diagnosis not present

## 2015-09-09 DIAGNOSIS — Z79899 Other long term (current) drug therapy: Secondary | ICD-10-CM | POA: Insufficient documentation

## 2015-09-09 LAB — BASIC METABOLIC PANEL
Anion gap: 6 (ref 5–15)
BUN: 7 mg/dL (ref 6–20)
CO2: 30 mmol/L (ref 22–32)
Calcium: 9.7 mg/dL (ref 8.9–10.3)
Chloride: 100 mmol/L — ABNORMAL LOW (ref 101–111)
Creatinine, Ser: 0.89 mg/dL (ref 0.61–1.24)
GFR calc Af Amer: >60 mL/min (ref >60)
GFR calc non Af Amer: >60 mL/min (ref >60)
Glucose, Bld: 90 mg/dL (ref 65–99)
Potassium: 4.1 mmol/L (ref 3.5–5.1)
Sodium: 136 mmol/L (ref 135–145)

## 2015-09-09 LAB — CBC WITH DIFFERENTIAL/PLATELET
Basophils Absolute: 0 10*3/uL (ref 0.0–0.1)
Basophils Relative: 0 %
Eosinophils Absolute: 0.2 10*3/uL (ref 0.0–0.7)
Eosinophils Relative: 3 %
HCT: 45.5 % (ref 39.0–52.0)
Hemoglobin: 16.1 g/dL (ref 13.0–17.0)
Lymphocytes Relative: 20 %
Lymphs Abs: 1.2 10*3/uL (ref 0.7–4.0)
MCH: 29.7 pg (ref 26.0–34.0)
MCHC: 35.4 g/dL (ref 30.0–36.0)
MCV: 83.8 fL (ref 78.0–100.0)
Monocytes Absolute: 0.5 10*3/uL (ref 0.1–1.0)
Monocytes Relative: 9 %
Neutro Abs: 4 10*3/uL (ref 1.7–7.7)
Neutrophils Relative %: 68 %
Platelets: 161 10*3/uL (ref 150–400)
RBC: 5.43 MIL/uL (ref 4.22–5.81)
RDW: 12.6 % (ref 11.5–15.5)
WBC: 5.9 10*3/uL (ref 4.0–10.5)

## 2015-09-09 MED ORDER — OXYCODONE-ACETAMINOPHEN 5-325 MG PO TABS: 1.0000 | ORAL_TABLET | ORAL | Status: DC | PRN

## 2015-09-09 MED ORDER — IOPAMIDOL (ISOVUE-370) INJECTION 76%
INTRAVENOUS | Status: AC
  Administered 2015-09-09: 50 mL
  Filled 2015-09-09: qty 50

## 2015-09-09 MED ORDER — OXYCODONE HCL 5 MG/5ML PO SOLN: 5.0000 mg | Freq: Four times a day (QID) | ORAL | Status: DC | PRN

## 2015-09-09 NOTE — ED Notes (Signed)
Declined W/C at D/C and was escorted to lobby by RN. 

## 2015-09-09 NOTE — Discharge Instructions (Signed)
Please contact your ENT specialist and inform him of today's visit and all relevant data. Please schedule follow up evaluation as soon as possible for further evaluation and management. Please return to the emergency room immediately if any new or worsening signs or symptoms present.

## 2015-09-09 NOTE — ED Notes (Signed)
Pt c/o right ear pain onset Friday that increases with chewing.

## 2015-09-09 NOTE — ED Provider Notes (Signed)
CSN: [id number]     Arrival date & time 09/09/15  1031 History   First MD Initiated Contact with Patient 09/09/15 1120     Chief Complaint  Patient presents with  . Otalgia    HPI   55 year old male presents today with neck pain. Patient reports that for the last week he's had posterior ear and lateral neck pain. Daughter reports that pain is similar to previous instance in which he was first diagnosed with cancer. 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 She notes that he's had CT scans every 6 months which have not shown any return of disease. Patient has been on able to receive his last CT scan, he has not had one in 8 months. She reports that he was given Cipro for the ear pain as it could likely have been an infection. She notes that she's contacted ENT at [place] with the patient is currently being cared for, and is unable to be seen until the end of June. Patient denies any fever, chills, nausea, vomiting, weight loss, Dizziness, headache night sweats. Patient reports the pain is worse with palpation, and significantly worse with chewing. He reports at baseline without any movement he has very minimal pain.   Past Medical History  Diagnosis Date  . 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 Cheek surgery    . Abdominal aortagram N/A 06/14/2014    Procedure: ABDOMINAL Maxcine Ham;  Surgeon: Conrad Payne, MD;  Location: [facility] CATH LAB;  Service: Cardiovascular;  Laterality: N/A;   Family History  Problem Relation Age of Onset  . Colon cancer Neg Hx    Social History  Substance Use Topics  . Smoking status: Never Smoker   . Smokeless tobacco: Current User    Types: Chew  . Alcohol Use: 0.0 oz/week    0 Standard drinks or equivalent per week     Comment: occ    Review of Systems  All other systems reviewed and are negative.   Allergies  Review  of patient's allergies indicates no known allergies.  Home Medications   Prior to Admission medications   Medication Sig Start Date End Date Taking? Authorizing Provider  amLODipine (NORVASC) 2.5 MG tablet Take 1 tablet (2.5 mg total) by mouth daily. 04/02/15   Pixie Casino, MD  oxyCODONE (ROXICODONE) 5 MG/5ML solution Take 5 mLs (5 mg total) by mouth every 6 (six) hours as needed for severe pain. 09/09/15   Lynee Rosenbach, PA-C   BP 133/86 mmHg  Pulse 94  Temp(Src) 97.3 F (36.3 C) (Oral)  Resp 18  Ht 5\' 6"  (1.676 m)  Wt 61.689 kg  BMI 21.96 kg/m2  SpO2 100% Physical Exam  Constitutional: He is oriented to person, place, and time. He appears well-developed and well-nourished.  HENT:  Head: Normocephalic and atraumatic.  Jaw and posterior pharynx abnormal due to post recession, no infectious etiology within the posterior oropharynx. Patient has very small amount of thrush on the posterior tongue.  Right ear external canal appears normal, no redness, erythema, tubes in place, patent, no tympanic redness, swelling, edema or discharge.   Eyes: Conjunctivae are normal. Pupils are equal, round, and reactive to light. Right eye exhibits no discharge. Left eye exhibits no discharge. No scleral icterus.  Neck: Normal range of motion. No JVD present. No tracheal deviation present.  Patient has  tenderness to palpation of the posterior mastoid and soft tissues of the neck  Pulmonary/Chest: Effort normal. No stridor.  Neurological: He is alert and oriented to person, place, and time. Coordination normal.  Psychiatric: He has a normal mood and affect. His behavior is normal. Judgment and thought content normal.  Nursing note and vitals reviewed.   ED Course  Procedures (including critical care time) Labs Review Labs Reviewed  BASIC METABOLIC PANEL - Abnormal; Notable for the following:    Chloride 100 (*)    All other components within normal limits  CBC WITH DIFFERENTIAL/PLATELET     Imaging Review Ct Angio Head W/cm &/or Wo Cm  09/09/2015  CLINICAL DATA:  Left neck pain. Rule out vertebral dissection on the left. EXAM: CT ANGIOGRAPHY HEAD AND NECK TECHNIQUE: Multidetector CT imaging of the head and neck was performed using the standard protocol during bolus administration of intravenous contrast. Multiplanar CT image reconstructions and MIPs were obtained to evaluate the vascular anatomy. Carotid stenosis measurements (when applicable) are obtained utilizing NASCET criteria, using the distal internal carotid diameter as the denominator. CONTRAST:  50 mL Isovue 370 IV COMPARISON:  None. FINDINGS: CT HEAD Brain: Ventricle size is normal. Negative for acute infarct. Negative for acute hemorrhage. No mass or edema. Calvarium and skull base: No skull lesion. High riding jugular bulb on the right. Paranasal sinuses: Paranasal sinuses clear. 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 Bony sclerosis right mastoid tip. Orbits: Bilateral coronal thickening and increased enhancement postcontrast administration. Question post radiation changes. Correlate with eye examine ophthalmology consultation. CTA NECK Aortic arch: Normal aortic arch. Calcified granuloma right upper lobe. Proximal great vessels widely patent. Right carotid system: Negative for right carotid stenosis. Early atherosclerotic plaque in the carotid bulb with possible small ulceration. Negative for dissection. Left carotid system: Normal left carotid. Negative for atherosclerotic disease or dissection. Vertebral arteries:Both vertebral arteries widely patent. Negative for stenosis or dissection. Skeleton: Mild cervical spine degenerative change. No fracture or bone lesion. 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 Resection of the left maxillary sinus and surrounding soft tissues. Large fat graft in the surgical bed. Resection of left submandibular gland. Surgical clips in  the left neck from lymph node dissection. No evidence of recurrent mass lesion. No prior postop baseline CT for comparison. CTA HEAD Anterior circulation: Cavernous carotid widely patent bilaterally without stenosis. Anterior and middle cerebral arteries widely patent without stenosis. Posterior circulation: Both vertebral arteries patent to the basilar. PICA patent bilaterally. AICA, superior cerebellar, posterior cerebral arteries patent bilaterally without stenosis Venous sinuses: Patent Anatomic variants: Negative for aneurysm. Delayed phase: Normal enhancement on delayed views. No enhancing mass lesion. IMPRESSION: Negative for carotid or vertebral artery dissection. No significant carotid or vertebral artery stenosis. Early plaque in the proximal right internal carotid artery with possible ulceration. No significant intracranial stenosis. Postop resection of left maxillary sinus for cancer treatment. Large fat graft in the area without evidence of recurrent mass or adenopathy in the neck. Increased enhancement and thickening of the choroid of both globes. Recommend correlation with eye exam. Electronically Signed   By: Franchot Gallo M.D.   On: 09/09/2015 14:24   Ct Angio Neck W/cm &/or Wo/cm  09/09/2015  CLINICAL DATA:  Left neck pain. Rule out vertebral dissection on the left. EXAM: CT ANGIOGRAPHY HEAD AND NECK TECHNIQUE: Multidetector CT imaging of the head and neck was performed using the standard protocol during bolus administration of intravenous contrast. Multiplanar CT image reconstructions and  MIPs were obtained to evaluate the vascular anatomy. Carotid stenosis measurements (when applicable) are obtained utilizing NASCET criteria, using the distal internal carotid diameter as the denominator. CONTRAST:  50 mL Isovue 370 IV COMPARISON:  None. FINDINGS: CT HEAD Brain: Ventricle size is normal. Negative for acute infarct. Negative for acute hemorrhage. No mass or edema. Calvarium and skull base: No  skull lesion. High riding jugular bulb on the right. Paranasal sinuses: Paranasal sinuses clear. 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 Bony sclerosis right mastoid tip. Orbits: Bilateral coronal thickening and increased enhancement postcontrast administration. Question post radiation changes. Correlate with eye examine ophthalmology consultation. CTA NECK Aortic arch: Normal aortic arch. Calcified granuloma right upper lobe. Proximal great vessels widely patent. Right carotid system: Negative for right carotid stenosis. Early atherosclerotic plaque in the carotid bulb with possible small ulceration. Negative for dissection. Left carotid system: Normal left carotid. Negative for atherosclerotic disease or dissection. Vertebral arteries:Both vertebral arteries widely patent. Negative for stenosis or dissection. Skeleton: Mild cervical spine degenerative change. No fracture or bone lesion. 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 Resection of the left maxillary sinus and surrounding soft tissues. Large fat graft in the surgical bed. Resection of left submandibular gland. Surgical clips in the left neck from lymph node dissection. No evidence of recurrent mass lesion. No prior postop baseline CT for comparison. CTA HEAD Anterior circulation: Cavernous carotid widely patent bilaterally without stenosis. Anterior and middle cerebral arteries widely patent without stenosis. Posterior circulation: Both vertebral arteries patent to the basilar. PICA patent bilaterally. AICA, superior cerebellar, posterior cerebral arteries patent bilaterally without stenosis Venous sinuses: Patent Anatomic variants: Negative for aneurysm. Delayed phase: Normal enhancement on delayed views. No enhancing mass lesion. IMPRESSION: Negative for carotid or vertebral artery dissection. No significant carotid or vertebral artery stenosis. Early plaque in the proximal right internal  carotid artery with possible ulceration. No significant intracranial stenosis. Postop resection of left maxillary sinus for cancer treatment. Large fat graft in the area without evidence of recurrent mass or adenopathy in the neck. Increased enhancement and thickening of the choroid of both globes. Recommend correlation with eye exam. Electronically Signed   By: Franchot Gallo M.D.   On: 09/09/2015 14:24   I have personally reviewed and evaluated these images and lab results as part of my medical decision-making.   EKG Interpretation None      MDM   Final diagnoses:  Neck pain    Labs:CBC, BMP  Imaging: CT angiogram neck, CT angiogram head  Consults:  Therapeutics:  Discharge Meds:   Assessment/Plan: 55 year old male presents today with pain to his ear and neck. Due to patient's significant history I have concern for recurrence. Patient has no other complaints other than pain, this is only when using his jaw. I discussed case with Dr. Jeneen Rinks who agreed that imaging at this time would be indicated, concern for vertebral dissection also on the differential as patient has had significant radiation and chemotherapy. Patient is unable to follow-up with his ENT, reading the notes he is alert he attempted this. I contacted radiology as I would like to make sure there is no form of malignancy, and also rule out dissection. They consult at the on-call radiologist who informed that he would be able to provide sufficient view of the soft tissues with the CT angiogram. Family pleased with imaging here.  Patient's results returned, they were read to the patient and daughter were for word, no signs of active malignancy  or acute need for further evaluation management here in the ED setting. Patient's daughter notes that she will be contacting the ENT tomorrow and requesting immediate follow-up evaluation. Patient's labs with no significant finding, patient will be given pain medication for pain related  symptoms. They're given strict return precautions, they verbalized understanding and agreement to today's plan had no further questions or concerns at the time discharge.          Okey Regal, PA-C 09/09/15 1633  Tanna Furry, MD 09/19/15 [phone]

## 2016-09-08 ENCOUNTER — Other Ambulatory Visit: Payer: Self-pay | Admitting: Internal Medicine

## 2016-09-10 ENCOUNTER — Telehealth (HOSPITAL_COMMUNITY): Payer: Self-pay | Admitting: Vascular Surgery

## 2016-09-10 NOTE — Telephone Encounter (Signed)
-----   Message from Conrad Fairview, MD sent at 09/10/2016 12:59 PM EDT ----- Regarding: RE: Is vascular study needed Pt's prior angiogram demonstrates nothing that we would offer the patient.  I would instead refer him to Rheumatology for treatment of his Raynaud's phenomena.   ----- Message ----- From: Rufina Falco Sent: 09/10/2016  10:51 AM To: Conrad , MD Subject: Is vascular study needed                       Dr. Precious Haws is referring patient back for "Raynaud's phenomenon w/o gangrene", burning sensation in both feet.  You did a workup in 2016.  Do you want a study prior to seeing patient, and would that be an ABI?  [facility]

## 2016-09-10 NOTE — Telephone Encounter (Signed)
Anderson Malta at Dr. Debroah Baller office made aware of Dr. Lianne Moris recommendation.

## 2020-02-01 ENCOUNTER — Encounter (HOSPITAL_COMMUNITY): Payer: Self-pay

## 2020-02-01 ENCOUNTER — Inpatient Hospital Stay (HOSPITAL_COMMUNITY): Payer: Medicaid Other | Admitting: Anesthesiology

## 2020-02-01 ENCOUNTER — Encounter (HOSPITAL_COMMUNITY)
Admission: EM | Disposition: A | Discharge status: Home / Self Care | Payer: Self-pay | Source: Home / Self Care | Attending: Orthopaedic Surgery

## 2020-02-01 ENCOUNTER — Inpatient Hospital Stay (HOSPITAL_COMMUNITY)
Admission: EM | Admit: 2020-02-01 | Discharge: 2020-02-02 | DRG: 494 | Disposition: A | Discharge status: Home / Self Care | Payer: Medicaid Other | Attending: Orthopaedic Surgery | Admitting: Orthopaedic Surgery

## 2020-02-01 ENCOUNTER — Emergency Department (HOSPITAL_COMMUNITY): Payer: Medicaid Other

## 2020-02-01 ENCOUNTER — Inpatient Hospital Stay (HOSPITAL_COMMUNITY): Payer: Medicaid Other

## 2020-02-01 ENCOUNTER — Other Ambulatory Visit: Payer: Self-pay

## 2020-02-01 DIAGNOSIS — Z23 Encounter for immunization: Secondary | ICD-10-CM | POA: Diagnosis not present

## 2020-02-01 DIAGNOSIS — S82402A Unspecified fracture of shaft of left fibula, initial encounter for closed fracture: Secondary | ICD-10-CM

## 2020-02-01 DIAGNOSIS — S82202A Unspecified fracture of shaft of left tibia, initial encounter for closed fracture: Secondary | ICD-10-CM | POA: Diagnosis present

## 2020-02-01 DIAGNOSIS — Z20822 Contact with and (suspected) exposure to covid-19: Secondary | ICD-10-CM | POA: Diagnosis present

## 2020-02-01 DIAGNOSIS — F17220 Nicotine dependence, chewing tobacco, uncomplicated: Secondary | ICD-10-CM | POA: Diagnosis present

## 2020-02-01 DIAGNOSIS — S82392A Other fracture of lower end of left tibia, initial encounter for closed fracture: Principal | ICD-10-CM | POA: Diagnosis present

## 2020-02-01 DIAGNOSIS — S92215A Nondisplaced fracture of cuboid bone of left foot, initial encounter for closed fracture: Secondary | ICD-10-CM

## 2020-02-01 DIAGNOSIS — S82832A Other fracture of upper and lower end of left fibula, initial encounter for closed fracture: Secondary | ICD-10-CM | POA: Diagnosis present

## 2020-02-01 DIAGNOSIS — Z09 Encounter for follow-up examination after completed treatment for conditions other than malignant neoplasm: Secondary | ICD-10-CM

## 2020-02-01 DIAGNOSIS — Z85819 Personal history of malignant neoplasm of unspecified site of lip, oral cavity, and pharynx: Secondary | ICD-10-CM | POA: Diagnosis not present

## 2020-02-01 HISTORY — PX: TIBIA IM NAIL INSERTION: SHX2516

## 2020-02-01 LAB — BASIC METABOLIC PANEL
Anion gap: 13 (ref 5–15)
BUN: 10 mg/dL (ref 8–23)
CO2: 24 mmol/L (ref 22–32)
Calcium: 9.5 mg/dL (ref 8.9–10.3)
Chloride: 98 mmol/L (ref 98–111)
Creatinine, Ser: 1.16 mg/dL (ref 0.61–1.24)
GFR, Estimated: >60 mL/min (ref >60)
Glucose, Bld: 111 mg/dL — ABNORMAL HIGH (ref 70–99)
Potassium: 3.6 mmol/L (ref 3.5–5.1)
Sodium: 135 mmol/L (ref 135–145)

## 2020-02-01 LAB — PROTIME-INR
INR: 1.1 (ref 0.8–1.2)
Prothrombin Time: 14 seconds (ref 11.4–15.2)

## 2020-02-01 LAB — CBC WITH DIFFERENTIAL/PLATELET
Abs Immature Granulocytes: 0.06 10*3/uL (ref 0.00–0.07)
Basophils Absolute: 0.1 10*3/uL (ref 0.0–0.1)
Basophils Relative: 1 %
Eosinophils Absolute: 0.1 10*3/uL (ref 0.0–0.5)
Eosinophils Relative: 1 %
HCT: 45.6 % (ref 39.0–52.0)
Hemoglobin: 14.3 g/dL (ref 13.0–17.0)
Immature Granulocytes: 1 %
Lymphocytes Relative: 27 %
Lymphs Abs: 2.2 10*3/uL (ref 0.7–4.0)
MCH: 24.1 pg — ABNORMAL LOW (ref 26.0–34.0)
MCHC: 31.4 g/dL (ref 30.0–36.0)
MCV: 76.9 fL — ABNORMAL LOW (ref 80.0–100.0)
Monocytes Absolute: 0.9 10*3/uL (ref 0.1–1.0)
Monocytes Relative: 11 %
Neutro Abs: 4.8 10*3/uL (ref 1.7–7.7)
Neutrophils Relative %: 59 %
Platelets: 271 10*3/uL (ref 150–400)
RBC: 5.93 MIL/uL — ABNORMAL HIGH (ref 4.22–5.81)
RDW: 16 % — ABNORMAL HIGH (ref 11.5–15.5)
WBC: 8.2 10*3/uL (ref 4.0–10.5)
nRBC: 0 % (ref 0.0–0.2)

## 2020-02-01 LAB — RESPIRATORY PANEL BY RT PCR (FLU A&B, COVID)
Influenza A by PCR: NEGATIVE
Influenza B by PCR: NEGATIVE
SARS Coronavirus 2 by RT PCR: NEGATIVE

## 2020-02-01 SURGERY — INSERTION, INTRAMEDULLARY ROD, TIBIA
Anesthesia: Spinal | Site: Leg Lower | Laterality: Left

## 2020-02-01 MED ORDER — CEFAZOLIN SODIUM-DEXTROSE 2-3 GM-%(50ML) IV SOLR
INTRAVENOUS | Status: DC | PRN
  Administered 2020-02-01: 2 g via INTRAVENOUS

## 2020-02-01 MED ORDER — VANCOMYCIN HCL 1000 MG IV SOLR
INTRAVENOUS | Status: DC | PRN
  Administered 2020-02-01: 1000 mg

## 2020-02-01 MED ORDER — MORPHINE SULFATE (PF) 2 MG/ML IV SOLN
2.0000 mg | INTRAVENOUS | Status: DC | PRN
  Administered 2020-02-01 (×2): 2 mg via INTRAVENOUS
  Filled 2020-02-01 (×2): qty 1

## 2020-02-01 MED ORDER — CEFAZOLIN SODIUM-DEXTROSE 2-4 GM/100ML-% IV SOLN: 2.0000 g | INTRAVENOUS | Status: DC

## 2020-02-01 MED ORDER — CEFAZOLIN SODIUM-DEXTROSE 1-4 GM/50ML-% IV SOLN
1.0000 g | Freq: Four times a day (QID) | INTRAVENOUS | Status: AC
  Administered 2020-02-02 (×3): 1 g via INTRAVENOUS
  Filled 2020-02-01 (×3): qty 50

## 2020-02-01 MED ORDER — EPHEDRINE SULFATE 50 MG/ML IJ SOLN
INTRAMUSCULAR | Status: DC | PRN
  Administered 2020-02-01 (×2): 10 mg via INTRAVENOUS

## 2020-02-01 MED ORDER — PHENYLEPHRINE HCL (PRESSORS) 10 MG/ML IV SOLN
INTRAVENOUS | Status: DC | PRN
  Administered 2020-02-01: 40 ug via INTRAVENOUS
  Administered 2020-02-01 (×2): 80 ug via INTRAVENOUS

## 2020-02-01 MED ORDER — MAGNESIUM CITRATE PO SOLN: 1.0000 | Freq: Once | ORAL | Status: DC | PRN

## 2020-02-01 MED ORDER — CEFAZOLIN SODIUM-DEXTROSE 2-4 GM/100ML-% IV SOLN
2.0000 g | Freq: Once | INTRAVENOUS | Status: AC
  Administered 2020-02-01: 2 g via INTRAVENOUS
  Filled 2020-02-01: qty 100

## 2020-02-01 MED ORDER — ONDANSETRON HCL 4 MG/2ML IJ SOLN
INTRAMUSCULAR | Status: DC | PRN
  Administered 2020-02-01: 4 mg via INTRAVENOUS

## 2020-02-01 MED ORDER — BISACODYL 5 MG PO TBEC: 5.0000 mg | DELAYED_RELEASE_TABLET | Freq: Every day | ORAL | Status: DC | PRN

## 2020-02-01 MED ORDER — MIDAZOLAM HCL 2 MG/2ML IJ SOLN
INTRAMUSCULAR | Status: DC | PRN
  Administered 2020-02-01 (×2): 1 mg via INTRAVENOUS

## 2020-02-01 MED ORDER — MIDAZOLAM HCL 2 MG/2ML IJ SOLN
INTRAMUSCULAR | Status: AC
  Filled 2020-02-01: qty 2

## 2020-02-01 MED ORDER — ONDANSETRON HCL 4 MG PO TABS: 4.0000 mg | ORAL_TABLET | Freq: Four times a day (QID) | ORAL | Status: DC | PRN

## 2020-02-01 MED ORDER — MORPHINE SULFATE (PF) 4 MG/ML IV SOLN: 4.0000 mg | Freq: Once | INTRAVENOUS | Status: AC

## 2020-02-01 MED ORDER — POVIDONE-IODINE 10 % EX SWAB: 2.0000 "application " | Freq: Once | CUTANEOUS | Status: DC

## 2020-02-01 MED ORDER — 0.9 % SODIUM CHLORIDE (POUR BTL) OPTIME
TOPICAL | Status: DC | PRN
  Administered 2020-02-01: 1000 mL

## 2020-02-01 MED ORDER — OXYCODONE HCL 5 MG PO TABS: 5.0000 mg | ORAL_TABLET | ORAL | Status: DC | PRN

## 2020-02-01 MED ORDER — DIPHENHYDRAMINE HCL 12.5 MG/5ML PO ELIX: 12.5000 mg | ORAL_SOLUTION | ORAL | Status: DC | PRN

## 2020-02-01 MED ORDER — METHOCARBAMOL 500 MG PO TABS
500.0000 mg | ORAL_TABLET | Freq: Four times a day (QID) | ORAL | Status: DC | PRN
  Administered 2020-02-01: 500 mg via ORAL
  Filled 2020-02-01: qty 1

## 2020-02-01 MED ORDER — PROPOFOL 10 MG/ML IV BOLUS
INTRAVENOUS | Status: DC | PRN
  Administered 2020-02-01 (×2): 20 mg via INTRAVENOUS

## 2020-02-01 MED ORDER — POLYETHYLENE GLYCOL 3350 17 G PO PACK: 17.0000 g | PACK | Freq: Every day | ORAL | Status: DC | PRN

## 2020-02-01 MED ORDER — ENOXAPARIN SODIUM 40 MG/0.4ML ~~LOC~~ SOLN: 40.0000 mg | SUBCUTANEOUS | Status: DC

## 2020-02-01 MED ORDER — TETANUS-DIPHTH-ACELL PERTUSSIS 5-2.5-18.5 LF-MCG/0.5 IM SUSP
0.5000 mL | Freq: Once | INTRAMUSCULAR | Status: AC
  Administered 2020-02-01: 0.5 mL via INTRAMUSCULAR
  Filled 2020-02-01: qty 0.5

## 2020-02-01 MED ORDER — DOCUSATE SODIUM 100 MG PO CAPS
100.0000 mg | ORAL_CAPSULE | Freq: Two times a day (BID) | ORAL | Status: DC
  Administered 2020-02-02: 100 mg via ORAL
  Filled 2020-02-01: qty 1

## 2020-02-01 MED ORDER — HYDROMORPHONE HCL 1 MG/ML IJ SOLN
0.5000 mg | INTRAMUSCULAR | Status: DC | PRN
  Administered 2020-02-02 (×2): 0.5 mg via INTRAVENOUS
  Filled 2020-02-01 (×2): qty 0.5

## 2020-02-01 MED ORDER — AMLODIPINE BESYLATE 2.5 MG PO TABS
2.5000 mg | ORAL_TABLET | Freq: Every day | ORAL | Status: DC
  Administered 2020-02-02: 2.5 mg via ORAL
  Filled 2020-02-01: qty 1

## 2020-02-01 MED ORDER — FENTANYL CITRATE (PF) 250 MCG/5ML IJ SOLN
INTRAMUSCULAR | Status: DC | PRN
Start: 2020-02-01 — End: 2020-02-01
  Administered 2020-02-01: 50 ug via INTRAVENOUS

## 2020-02-01 MED ORDER — METOCLOPRAMIDE HCL 5 MG/ML IJ SOLN: 5.0000 mg | Freq: Three times a day (TID) | INTRAMUSCULAR | Status: DC | PRN

## 2020-02-01 MED ORDER — FENTANYL CITRATE (PF) 250 MCG/5ML IJ SOLN
INTRAMUSCULAR | Status: AC
  Filled 2020-02-01: qty 5

## 2020-02-01 MED ORDER — BUPIVACAINE IN DEXTROSE 0.75-8.25 % IT SOLN
INTRATHECAL | Status: DC | PRN
  Administered 2020-02-01: 1.8 mL via INTRATHECAL

## 2020-02-01 MED ORDER — VANCOMYCIN HCL 1000 MG IV SOLR
INTRAVENOUS | Status: AC
  Filled 2020-02-01: qty 1000

## 2020-02-01 MED ORDER — OXYCODONE HCL 5 MG PO TABS
10.0000 mg | ORAL_TABLET | ORAL | Status: DC | PRN
  Administered 2020-02-02 (×2): 10 mg via ORAL
  Filled 2020-02-01 (×2): qty 2

## 2020-02-01 MED ORDER — ACETAMINOPHEN 500 MG PO TABS
1000.0000 mg | ORAL_TABLET | Freq: Three times a day (TID) | ORAL | Status: DC
  Administered 2020-02-01 – 2020-02-02 (×3): 1000 mg via ORAL
  Filled 2020-02-01 (×3): qty 2

## 2020-02-01 MED ORDER — CHLORHEXIDINE GLUCONATE 4 % EX LIQD
60.0000 mL | Freq: Once | CUTANEOUS | Status: DC
  Filled 2020-02-01: qty 60

## 2020-02-01 MED ORDER — MORPHINE SULFATE (PF) 4 MG/ML IV SOLN
INTRAVENOUS | Status: AC
  Administered 2020-02-01: 4 mg via INTRAVENOUS
  Filled 2020-02-01: qty 1

## 2020-02-01 MED ORDER — ONDANSETRON HCL 4 MG/2ML IJ SOLN: 4.0000 mg | Freq: Four times a day (QID) | INTRAMUSCULAR | Status: DC | PRN

## 2020-02-01 MED ORDER — LACTATED RINGERS IV SOLN: INTRAVENOUS | Status: DC | PRN

## 2020-02-01 MED ORDER — CELECOXIB 200 MG PO CAPS
200.0000 mg | ORAL_CAPSULE | Freq: Two times a day (BID) | ORAL | Status: DC
  Administered 2020-02-01 – 2020-02-02 (×2): 200 mg via ORAL
  Filled 2020-02-01 (×2): qty 1

## 2020-02-01 MED ORDER — METHOCARBAMOL 1000 MG/10ML IJ SOLN
500.0000 mg | Freq: Four times a day (QID) | INTRAVENOUS | Status: DC | PRN
  Filled 2020-02-01: qty 5

## 2020-02-01 MED ORDER — METOCLOPRAMIDE HCL 5 MG PO TABS: 5.0000 mg | ORAL_TABLET | Freq: Three times a day (TID) | ORAL | Status: DC | PRN

## 2020-02-01 MED ORDER — CEFAZOLIN SODIUM-DEXTROSE 2-4 GM/100ML-% IV SOLN
2.0000 g | INTRAVENOUS | Status: DC
  Filled 2020-02-01: qty 100

## 2020-02-01 SURGICAL SUPPLY — 68 items
BIT DRILL 4.0X165 AO STYLE (BIT) ×6 IMPLANT
BIT DRILL 4.0X280 (BIT) ×3 IMPLANT
BNDG COHESIVE 4X5 TAN STRL (GAUZE/BANDAGES/DRESSINGS) ×3 IMPLANT
BNDG ELASTIC 3X5.8 VLCR STR LF (GAUZE/BANDAGES/DRESSINGS) ×3 IMPLANT
BNDG ELASTIC 6X10 VLCR STRL LF (GAUZE/BANDAGES/DRESSINGS) ×3 IMPLANT
BNDG ELASTIC 6X15 VLCR STRL LF (GAUZE/BANDAGES/DRESSINGS) ×3 IMPLANT
BNDG ELASTIC 6X5.8 VLCR STR LF (GAUZE/BANDAGES/DRESSINGS) ×3 IMPLANT
BNDG GAUZE ELAST 4 BULKY (GAUZE/BANDAGES/DRESSINGS) ×3 IMPLANT
CANISTER SUCT 3000ML PPV (MISCELLANEOUS) ×3 IMPLANT
CHLORAPREP W/TINT 26 (MISCELLANEOUS) ×3 IMPLANT
CLSR STERI-STRIP ANTIMIC 1/2X4 (GAUZE/BANDAGES/DRESSINGS) ×3 IMPLANT
COVER MAYO STAND STRL (DRAPES) ×3 IMPLANT
COVER SURGICAL LIGHT HANDLE (MISCELLANEOUS) ×3 IMPLANT
COVER WAND RF STERILE (DRAPES) IMPLANT
DRAPE C-ARM 42X72 X-RAY (DRAPES) ×3 IMPLANT
DRAPE C-ARMOR (DRAPES) ×3 IMPLANT
DRAPE IMP U-DRAPE 54X76 (DRAPES) ×3 IMPLANT
DRAPE U-SHAPE 47X51 STRL (DRAPES) ×3 IMPLANT
DRSG AQUACEL AG ADV 3.5X 6 (GAUZE/BANDAGES/DRESSINGS) ×3 IMPLANT
ELECT CAUTERY BLADE 6.4 (BLADE) ×3 IMPLANT
ELECT REM PT RETURN 9FT ADLT (ELECTROSURGICAL) ×3
ELECTRODE REM PT RTRN 9FT ADLT (ELECTROSURGICAL) ×2 IMPLANT
GAUZE SPONGE 4X4 12PLY STRL (GAUZE/BANDAGES/DRESSINGS) ×3 IMPLANT
GAUZE SPONGE 4X4 12PLY STRL LF (GAUZE/BANDAGES/DRESSINGS) ×3 IMPLANT
GAUZE XEROFORM 1X8 LF (GAUZE/BANDAGES/DRESSINGS) ×3 IMPLANT
GLOVE BIO SURGEON STRL SZ 6.5 (GLOVE) ×3 IMPLANT
GLOVE BIOGEL PI IND STRL 6.5 (GLOVE) ×2 IMPLANT
GLOVE BIOGEL PI IND STRL 7.0 (GLOVE) ×2 IMPLANT
GLOVE BIOGEL PI IND STRL 7.5 (GLOVE) ×2 IMPLANT
GLOVE BIOGEL PI IND STRL 8 (GLOVE) ×2 IMPLANT
GLOVE BIOGEL PI INDICATOR 6.5 (GLOVE) ×1
GLOVE BIOGEL PI INDICATOR 7.0 (GLOVE) ×1
GLOVE BIOGEL PI INDICATOR 7.5 (GLOVE) ×1
GLOVE BIOGEL PI INDICATOR 8 (GLOVE) ×1
GLOVE ECLIPSE 8.0 STRL XLNG CF (GLOVE) ×3 IMPLANT
GLOVE INDICATOR 6.5 STRL GRN (GLOVE) ×3 IMPLANT
GOWN STRL REUS W/ TWL LRG LVL3 (GOWN DISPOSABLE) ×4 IMPLANT
GOWN STRL REUS W/ TWL XL LVL3 (GOWN DISPOSABLE) ×2 IMPLANT
GOWN STRL REUS W/TWL LRG LVL3 (GOWN DISPOSABLE) ×2
GOWN STRL REUS W/TWL XL LVL3 (GOWN DISPOSABLE) ×1
GUIDE PIN 3.2X330 (PIN) ×3
GUIDEWIRE BALL NOSE 3.0X900 (WIRE) ×3 IMPLANT
KIT BASIN OR (CUSTOM PROCEDURE TRAY) ×3 IMPLANT
KIT TURNOVER KIT B (KITS) ×3 IMPLANT
NAIL TIBIAL 9X33 DA VINCI (Nail) ×3 IMPLANT
NS IRRIG 1000ML POUR BTL (IV SOLUTION) ×3 IMPLANT
PACK ORTHO EXTREMITY (CUSTOM PROCEDURE TRAY) ×3 IMPLANT
PAD ABD 8X10 STRL (GAUZE/BANDAGES/DRESSINGS) ×3 IMPLANT
PAD ARMBOARD 7.5X6 YLW CONV (MISCELLANEOUS) ×6 IMPLANT
PIN GUIDE 3.2X330 (PIN) ×2 IMPLANT
SCREW LOCK CORT 5.0X50 (Screw) ×3 IMPLANT
SCREW LOCK CORT 5X36 (Screw) ×3 IMPLANT
SCREW LOCK FEM 5X44 (Screw) ×3 IMPLANT
SCREW LOCK FEM 5X46 (Screw) ×3 IMPLANT
SPONGE LAP 18X18 RF (DISPOSABLE) IMPLANT
STAPLER VISISTAT 35W (STAPLE) IMPLANT
SUT ETHILON 3 0 PS 1 (SUTURE) ×9 IMPLANT
SUT MNCRL AB 4-0 PS2 18 (SUTURE) ×3 IMPLANT
SUT VIC AB 0 CT1 27 (SUTURE) ×1
SUT VIC AB 0 CT1 27XBRD ANBCTR (SUTURE) ×2 IMPLANT
SUT VIC AB 1 CT1 27 (SUTURE) ×1
SUT VIC AB 1 CT1 27XBRD ANBCTR (SUTURE) ×2 IMPLANT
SUT VIC AB 2-0 CT1 27 (SUTURE) ×1
SUT VIC AB 2-0 CT1 TAPERPNT 27 (SUTURE) ×2 IMPLANT
SYR BULB IRRIG 60ML STRL (SYRINGE) ×3 IMPLANT
TUBE CONNECTING 12X1/4 (SUCTIONS) ×3 IMPLANT
WATER STERILE IRR 1000ML POUR (IV SOLUTION) ×3 IMPLANT
YANKAUER SUCT BULB TIP NO VENT (SUCTIONS) ×3 IMPLANT

## 2020-02-01 NOTE — Progress Notes (Signed)
Orthopedic Tech Progress Note Patient Details:  Vincent Heath [birth year] [id number]  Ortho Devices Type of Ortho Device: CAM walker Ortho Device/Splint Location: LLE Ortho Device/Splint Interventions: Application, Adjustment   Post Interventions Patient Tolerated: Well Instructions Provided: Adjustment of device   Tressie Ragin E [name] 02/01/2020, 11:38 PM
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
## 2020-02-01 NOTE — Anesthesia Preprocedure Evaluation (Signed)
Anesthesia Evaluation  Patient identified by MRN, date of birth, ID band Patient awake    Reviewed: Allergy & Precautions, NPO status , Patient's Chart, lab work & pertinent test results  Airway Mallampati: IV  TM Distance: <3 FB Neck ROM: Full  Mouth opening: Limited Mouth Opening  Dental  (+) Edentulous Upper, Edentulous Lower   Pulmonary  S/p tracheostomy   Pulmonary exam normal breath sounds clear to auscultation       Cardiovascular Normal cardiovascular exam Rhythm:Regular Rate:Normal  ECG: rate 86   Neuro/Psych negative neurological ROS  negative psych ROS   GI/Hepatic negative GI ROS, Neg liver ROS,   Endo/Other  negative endocrine ROS  Renal/GU negative Renal ROS     Musculoskeletal negative musculoskeletal ROS (+)   Abdominal   Peds  Hematology negative hematology ROS (+)   Anesthesia Other Findings Left tibia fx  Reproductive/Obstetrics                             Anesthesia Physical Anesthesia Plan  ASA: II and emergent  Anesthesia Plan: Spinal   Post-op Pain Management:    Induction: Intravenous  PONV Risk Score and Plan: 1 and Ondansetron, Dexamethasone, Midazolam and Treatment may vary due to age or medical condition  Airway Management Planned: Natural Airway  Additional Equipment:   Intra-op Plan:   Post-operative Plan:   Informed Consent: I have reviewed the patients History and Physical, chart, labs and discussed the procedure including the risks, benefits and alternatives for the proposed anesthesia with the patient or authorized representative who has indicated his/her understanding and acceptance.     Dental advisory given and Interpreter used for interveiw  Plan Discussed with: CRNA  Anesthesia Plan Comments: (Son assisted with interpretation. 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

## 2020-02-01 NOTE — ED Notes (Signed)
Got patient undress on the monitor did ekg shown to to provider patient is resting with call bell in reach

## 2020-02-01 NOTE — Anesthesia Procedure Notes (Signed)
Spinal  Patient location during procedure: OR Start time: 02/01/2020 8:20 PM End time: 02/01/2020 8:25 PM Staffing Performed: anesthesiologist  Anesthesiologist: Murvin Natal, MD Preanesthetic Checklist Completed: patient identified, IV checked, risks and benefits discussed, surgical consent, monitors and equipment checked, pre-op evaluation and timeout performed Spinal Block Patient position: left lateral decubitus Prep: DuraPrep Patient monitoring: cardiac monitor, continuous pulse ox and blood pressure Approach: midline Location: L4-5 Injection technique: single-shot Needle Needle type: Pencan  Needle gauge: 24 G Needle length: 9 cm Assessment Sensory level: T10 Additional Notes Functioning IV was confirmed and monitors were applied. Sterile prep and drape, including hand hygiene and sterile gloves were used. The patient was positioned and the spine was prepped. The skin was anesthetized with lidocaine.  Free flow of clear CSF was obtained prior to injecting local anesthetic into the CSF.  The spinal needle aspirated freely following injection.  The needle was carefully withdrawn.  The patient tolerated the procedure well.

## 2020-02-01 NOTE — Transfer of Care (Signed)
Immediate Anesthesia Transfer of Care Note  Patient: Vincent Heath  Procedure(s) Performed: INTRAMEDULLARY (IM) NAIL TIBIAL (Left Leg Lower)  Patient Location: PACU  Anesthesia Type:Spinal  Level of Consciousness: awake, alert  and oriented  Airway & Oxygen Therapy: Patient Spontanous Breathing  Post-op Assessment: Report given to RN and Post -op Vital signs reviewed and stable  Post vital signs: Reviewed and stable  Last Vitals:  Vitals Value Taken Time  BP    Temp    Pulse    Resp    SpO2      Last Pain:  Vitals:   02/01/20 1533  TempSrc:   PainSc: Asleep         Complications: No complications documented.
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
## 2020-02-01 NOTE — Interval H&P Note (Signed)
History and Physical Interval Note:  02/01/2020 7:13 PM  [name]  has presented today for surgery, with the diagnosis of Left tibia fx.  The various methods of treatment have been discussed with the patient and family. After consideration of risks, benefits and other options for treatment, the patient has consented to  Procedure(s): INTRAMEDULLARY (IM) NAIL TIBIAL (Left) OPEN REDUCTION INTERNAL FIXATION (ORIF) TIBIA/FIBULA FRACTURE (Left) as a surgical intervention.  The patient's history has been reviewed, patient examined, no change in status, stable for surgery.  I have reviewed the patient's chart and labs.  Questions were answered to the patient's satisfaction.     Vincent Heath

## 2020-02-01 NOTE — Anesthesia Postprocedure Evaluation (Signed)
Anesthesia Post Note  Patient: Vincent Heath  Procedure(s) Performed: INTRAMEDULLARY (IM) NAIL TIBIAL (Left Leg Lower)     Patient location during evaluation: PACU Anesthesia Type: Spinal Level of consciousness: oriented and awake and alert Pain management: pain level controlled Vital Signs Assessment: post-procedure vital signs reviewed and stable Respiratory status: spontaneous breathing, respiratory function stable and patient connected to nasal cannula oxygen Cardiovascular status: blood pressure returned to baseline and stable Postop Assessment: no headache, no backache, no apparent nausea or vomiting and spinal receding Anesthetic complications: no   No complications documented.  Last Vitals:  Vitals:   02/01/20 2254 02/01/20 2309  BP: 114/81 116/84  Pulse: 93 96  Resp:    Temp:    SpO2: 100%     Last Pain:  Vitals:   02/01/20 2215  TempSrc:   PainSc: 0-No pain                 Vincent Heath P Zainah Steven

## 2020-02-02 ENCOUNTER — Encounter (HOSPITAL_COMMUNITY): Payer: Self-pay | Admitting: Orthopaedic Surgery

## 2020-02-02 LAB — CBC
HCT: 40.1 % (ref 39.0–52.0)
Hemoglobin: 12.8 g/dL — ABNORMAL LOW (ref 13.0–17.0)
MCH: 24.7 pg — ABNORMAL LOW (ref 26.0–34.0)
MCHC: 31.9 g/dL (ref 30.0–36.0)
MCV: 77.3 fL — ABNORMAL LOW (ref 80.0–100.0)
Platelets: 226 10*3/uL (ref 150–400)
RBC: 5.19 MIL/uL (ref 4.22–5.81)
RDW: 16.3 % — ABNORMAL HIGH (ref 11.5–15.5)
WBC: 9.4 10*3/uL (ref 4.0–10.5)
nRBC: 0 % (ref 0.0–0.2)

## 2020-02-02 LAB — BASIC METABOLIC PANEL
Anion gap: 9 (ref 5–15)
BUN: 9 mg/dL (ref 8–23)
CO2: 28 mmol/L (ref 22–32)
Calcium: 8.9 mg/dL (ref 8.9–10.3)
Chloride: 98 mmol/L (ref 98–111)
Creatinine, Ser: 1.09 mg/dL (ref 0.61–1.24)
GFR, Estimated: >60 mL/min (ref >60)
Glucose, Bld: 155 mg/dL — ABNORMAL HIGH (ref 70–99)
Potassium: 3.5 mmol/L (ref 3.5–5.1)
Sodium: 135 mmol/L (ref 135–145)

## 2020-02-02 LAB — HIV ANTIBODY (ROUTINE TESTING W REFLEX): HIV Screen 4th Generation wRfx: NONREACTIVE

## 2020-02-02 MED ORDER — ONDANSETRON HCL 4 MG PO TABS: 4.0000 mg | ORAL_TABLET | Freq: Three times a day (TID) | ORAL | 1 refills | Status: AC | PRN

## 2020-02-02 MED ORDER — OXYCODONE HCL 5 MG PO TABS
ORAL_TABLET | ORAL | 0 refills | Status: DC
Start: 2020-02-02 — End: 2020-02-02

## 2020-02-02 MED ORDER — CELECOXIB 100 MG PO CAPS: 100.0000 mg | ORAL_CAPSULE | Freq: Two times a day (BID) | ORAL | 0 refills | Status: AC

## 2020-02-02 MED ORDER — ACETAMINOPHEN 500 MG PO TABS: 1000.0000 mg | ORAL_TABLET | Freq: Three times a day (TID) | ORAL | 0 refills | Status: AC

## 2020-02-02 MED ORDER — CELECOXIB 100 MG PO CAPS: 100.0000 mg | ORAL_CAPSULE | Freq: Two times a day (BID) | ORAL | 0 refills | Status: DC

## 2020-02-02 MED ORDER — ONDANSETRON HCL 4 MG PO TABS: 4.0000 mg | ORAL_TABLET | Freq: Three times a day (TID) | ORAL | 1 refills | Status: DC | PRN

## 2020-02-02 MED ORDER — RIVAROXABAN 10 MG PO TABS: 10.0000 mg | ORAL_TABLET | Freq: Every day | ORAL | 0 refills | Status: DC

## 2020-02-02 MED ORDER — RIVAROXABAN 10 MG PO TABS: 10.0000 mg | ORAL_TABLET | Freq: Every day | ORAL | 0 refills | Status: AC

## 2020-02-02 MED ORDER — ACETAMINOPHEN 500 MG PO TABS: 1000.0000 mg | ORAL_TABLET | Freq: Three times a day (TID) | ORAL | 0 refills | Status: DC

## 2020-02-02 MED ORDER — OXYCODONE HCL 5 MG PO TABS
ORAL_TABLET | ORAL | 0 refills | Status: AC
Start: 2020-02-02 — End: 2020-02-07

## 2020-02-02 NOTE — Progress Notes (Signed)
Discharge instructions addressed; Pt in stable condition; rolling walker & 3-1 BSC in room; wheelchair to be delivered home tomorrow; Son & wife in room and will be pt.'s ride home.

## 2020-02-02 NOTE — Progress Notes (Signed)
    Durable Medical Equipment  (From admission, onward)         Start     Ordered   02/02/20 1310  For home use only DME lightweight manual wheelchair with seat cushion  Once       Comments: Patient suffers from Left tibia fracture  which impairs their ability to perform daily activities like walking in the home.  A rolling walker will not resolve  issue with performing activities of daily living. A wheelchair will allow patient to safely perform daily activities. Patient is not able to propel themselves in the home using a standard weight wheelchair due to weakness. Patient can self propel in the lightweight wheelchair. Length of need 6 months. Accessories: elevating leg rests (ELRs), wheel locks, extensions and anti-tippers.   02/02/20 1311   02/02/20 1224  For home use only DME 3 n 1  Once        02/02/20 1223   02/02/20 1224  For home use only DME Walker rolling  Once       Question Answer Comment  Walker: With [place]   Patient needs a walker to treat with the following condition Fracture of tibial shaft, left, closed      02/02/20 1223

## 2020-02-02 NOTE — Plan of Care (Signed)
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
## 2020-02-02 NOTE — Progress Notes (Signed)
   ORTHOPAEDIC PROGRESS NOTE  s/p Procedure(s): INTRAMEDULLARY (IM) NAIL TIBIAL  SUBJECTIVE: Translation provided by telephone translator service. Patient reports having some pain during the night, which made it hard to sleep. Pain has improved with pain medications. He has urinated since surgery and did not require a foley. No chest pain. No SOB. No nausea/vomiting. No other complaints. He is ready for breakfast. He lives at home with his wife and family members. He has good care at home.   OBJECTIVE: PE: General: resting comfortably in hospital bed, no acute distress Cardiac: regular rate Pulmonary: no increased work of breathing GI: Abdomen soft, non-tender Left lower extremity: dressings CDI, tender to palpation left knee, compartments soft and compressible, intact EHL/TA/GSC, endorses distal sensation, warm well perfused foot  Vitals:   02/01/20 2336 02/02/20 0328  BP: 112/82 126/83  Pulse: 94 83  Resp: 17 15  Temp: 97.8 F (36.6 C) 98.2 F (36.8 C)  SpO2: 98% 99%     ASSESSMENT: Vincent Heath is a 59 y.o. male doing well postoperatively. POD#1  PLAN: Weightbearing: NWB LLE Insicional and dressing care: Reinforce dressings as needed Orthopedic device(s): CAM boot Showering: Post-op day #3 with assistance VTE prophylaxis: Lovenox 40mg  qd will transition to Xarelto 10 mg on discharge Pain control: PRN pain medications, preferring oral medications  Follow - up plan: 1 week in office with Dr. Damien Fusi information:  Weekdays 8-5 Noemi Chapel PA-C [phone], After hours and holidays please check [email or web address] for group call information for [facility]   Patient is doing well after surgery. No complaints. Pain controlled. Vitals sign stable. Will have PT evaluate the patient for discharge/DME needs. Patient wants to go home to the care of his family. Patient likely to be discharged today if he ambulates well with therapy. Patient feels comfortable about this  plan.   Noemi Chapel, PA-C 02/02/2020
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
## 2020-02-02 NOTE — Discharge Instructions (Signed)
   Intramedullary Nailing of Tibial Diaphyseal Fracture  Intramedullary (IM) nailing of tibial diaphyseal fracture is a procedure in which a metal rod or wire (nail) is placed inside a bone in the lower leg (tibia). The nail holds the broken (fractured) bone in place while it heals. The bone may take up to 4-6 months to heal. This procedure is usually done when a large force, such as from a car or sports accident, has fractured the tibia. Often, the other bone in the lower leg (fibula) is also fractured at the same time as the tibia. What are the risks?  Infection.  Allergic reaction to medicines.  Damage to other structures or organs, such as nerves or blood vessels.  Blood clot.  Scarring.  Failure of the bone to heal properly (nonunion).  Needing another procedure to remove the IM nail if it causes pain.  Severe swelling and pain in the injured leg (compartment syndrome). What happens during the procedure?  To lower your risk of infection: ? Your health care team will wash or sanitize their hands. ? Your skin will be washed with soap. ? Hair may be removed from the surgical area.  An IV will be inserted into one of your veins.  You may be given one or both of the following: ? A medicine to help you relax (sedative). ? A medicine to make you fall asleep (general anesthetic).  An incision will be made below your knee on the front side of your leg.  Skin and tissue will be moved out of the way with a surgical device.  A metal rod may be placed in the front of your knee and down into your tibia to keep the fractured section of bone in place.  An X-ray or a series of X-rays may be done to make sure that the rod is in the correct place.  The IM nail will be screwed into place on either side of the fracture to allow the bone to heal.  An X-ray will be done to make sure that the IM nail is in the correct place.  Tendons and other tissue will be closed with stitches (sutures)  that do not need to be removed.  Your incision will be closed with sutures.  A bandage (dressing) may be placed over your incision. The procedure may vary among health care providers and hospitals. What happens after the procedure?  Your blood pressure, heart rate, breathing rate, and blood oxygen level will be monitored until the medicines you were given have worn off.  You may continue to receive fluids and medicines through an IV tube.  You will have some leg pain. You will be given medicines as needed.  You will be encouraged to do deep breathing exercises.  Do not drive until your health care provider approves. Summary  Intramedullary (IM) nailing of tibial diaphyseal fracture is a procedure in which a metal rod or wire (nail) is placed inside a bone in the lower leg (tibia). The bone may take up to 4-6 months to heal.  You should not use any products that contain nicotine or tobacco for as long as directed before your procedure. This information is not intended to replace advice given to you by your health care provider. Make sure you discuss any questions you have with your health care provider. Document Revised: 04/04/2018 Document Reviewed: 06/03/2016 Elsevier Patient Education  2020 [employer].

## 2020-02-05 ENCOUNTER — Encounter (HOSPITAL_COMMUNITY): Payer: Self-pay | Admitting: Orthopaedic Surgery

## 2020-03-28 ENCOUNTER — Encounter: Payer: Self-pay | Admitting: Physical Therapy

## 2020-03-28 ENCOUNTER — Ambulatory Visit: Payer: Medicaid Other | Attending: Orthopaedic Surgery | Admitting: Physical Therapy

## 2020-03-28 ENCOUNTER — Other Ambulatory Visit: Payer: Self-pay

## 2020-03-28 DIAGNOSIS — R262 Difficulty in walking, not elsewhere classified: Secondary | ICD-10-CM

## 2020-03-28 DIAGNOSIS — M79662 Pain in left lower leg: Secondary | ICD-10-CM | POA: Diagnosis present

## 2020-03-28 DIAGNOSIS — R600 Localized edema: Secondary | ICD-10-CM

## 2020-03-28 DIAGNOSIS — M25672 Stiffness of left ankle, not elsewhere classified: Secondary | ICD-10-CM

## 2020-03-28 DIAGNOSIS — S82202D Unspecified fracture of shaft of left tibia, subsequent encounter for closed fracture with routine healing: Secondary | ICD-10-CM
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
## 2020-04-02 ENCOUNTER — Ambulatory Visit: Payer: Medicaid Other | Admitting: Physical Therapy

## 2020-04-02 ENCOUNTER — Other Ambulatory Visit: Payer: Self-pay

## 2020-04-02 ENCOUNTER — Encounter: Payer: Self-pay | Admitting: Physical Therapy

## 2020-04-02 DIAGNOSIS — S82202D Unspecified fracture of shaft of left tibia, subsequent encounter for closed fracture with routine healing: Secondary | ICD-10-CM

## 2020-04-02 DIAGNOSIS — M25672 Stiffness of left ankle, not elsewhere classified: Secondary | ICD-10-CM

## 2020-04-02 DIAGNOSIS — M79662 Pain in left lower leg: Secondary | ICD-10-CM

## 2020-04-02 DIAGNOSIS — R262 Difficulty in walking, not elsewhere classified: Secondary | ICD-10-CM

## 2020-04-02 DIAGNOSIS — R600 Localized edema: Secondary | ICD-10-CM

## 2020-04-02 NOTE — Therapy (Signed)
[place], [place], [address] Phone: [phone]   Fax:  [phone]  Physical Therapy Treatment  Patient Details  Name: Vincent Heath MRN: [id number] Date of Birth: 1960/09/19 Referring Provider (PT): Ophelia Charter, MD   Encounter Date: 04/02/2020   PT End of Session - 04/02/20 1250    Visit Number 2    Number of Visits 12    Date for PT Re-Evaluation 05/09/20    Authorization Type [facility] MCD-requesting authorization for initial 3 treatment visits    Authorization Time Period 04/02/20-06/02/19    Authorization - Visit Number 1    Authorization - Number of Visits 10    PT Start Time 1225    PT Stop Time 1303    PT Time Calculation (min) 38 min           Past Medical History:  Diagnosis Date  . 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 ABDOMINAL AORTAGRAM N/A 06/14/2014   Procedure: ABDOMINAL Maxcine Ham;  Surgeon: Conrad Morley, MD;  Location: [facility] CATH LAB;  Service: Cardiovascular;  Laterality: N/A;  . cheek surgery    . TIBIA IM NAIL INSERTION Left 02/01/2020   Procedure: INTRAMEDULLARY (IM) NAIL TIBIAL;  Surgeon: Hiram Gash, MD;  Location: [facility];  Service: Orthopedics;  Laterality: Left;    There were no vitals filed for this visit.   Subjective Assessment - 04/02/20 1228    Subjective Mild pain. It does not hurt when I am walking.    Currently in Pain? No/denies                             [facility] Adult PT Treatment/Exercise - 04/02/20 0001      Ambulation/Gait   Ambulation/Gait Yes    Ambulation/Gait Assistance 6: Modified independent (Device/Increase time)    Ambulation Distance (Feet) 200 Feet    Assistive device Rolling walker    Pre-Gait Activities cues for step through pattern- able to return demonstration    Gait Comments Pt. ambulates in clinic mod I with RW and CAM boot left LE with "step-to" gait pattern with left foot  turned out      Knee/Hip Exercises: Stretches   Other Knee/Hip Stretches seated DF stretch with towel x 3 for 30 seconds      Knee/Hip Exercises: Seated   Long Arc Quad 20 reps    Hamstring Curl 20 reps    Hamstring Limitations red band      Knee/Hip Exercises: Supine   Straight Leg Raises 20 reps      Knee/Hip Exercises: Sidelying   Hip ABduction 20 reps      Manual Therapy   Manual therapy comments passive ankle 4 way stretching      Ankle Exercises: Seated   Ankle Circles/Pumps 5 reps    Towel Crunch 5 reps    Heel Raises 10 reps    Toe Raise 10 reps    Other Seated Ankle Exercises seated Rocker board A/P                    PT Short Term Goals - 03/28/20 1522      PT SHORT TERM GOAL #1   Title Independent with HEP    Baseline needs HEP    Time 3    Period Weeks    Status New    Target Date  04/18/20      PT SHORT TERM GOAL #2   Title Increase left ankle DF AROM at least 3-5 deg to improve toe clearance with gait    Baseline 2 deg    Time 3    Period Weeks    Status New    Target Date 04/18/20      PT SHORT TERM GOAL #3   Title Pt. to ambulate mod I with RW and CAM boot left LE with bilateral step-through gait with neutral left foot position    Baseline uses "step-to" gait pattern with left foot turned out    Time 3    Period Weeks    Status New    Target Date 04/18/20             PT Long Term Goals - 03/28/20 1524      PT LONG TERM GOAL #1   Title Left ankle strength grossly 4/5 or greater to improve ankle stability for outdoor ambulation over uneven surfaces    Baseline MMTs not tested at eval given recent post-op status    Time 6    Period Weeks    Status New    Target Date 05/09/20      PT LONG TERM GOAL #2   Title Pt. to be able to perform community ambulation mod I with SPC vs. LRAD with left LE status for  boot vs. brace pending progression as allowed per MD at follow up visits    Baseline uses RW with CAM boot left LE    Time  6    Period Weeks    Status New    Target Date 05/09/20      PT LONG TERM GOAL #3   Title Pt. 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 Gait training provided for step thorugh pattern if tolerated. Continued with AROM and stretching for ankle and LE. He tolerated the session well with no c/o pain.    PT Next Visit Plan ask Advance directive questions and update chart, no estim due to recent CA history and caution with any cryo due to Raynaud's, no dry needling due to history chronic venous insufficiency, pt. WBAT in CAM boot, work in seated ROM with BAPS, seated tilt board, Theraband ankle 4-way, stretch gastroc, gait training with CAM boot and RW progress to cane as safely able    PT Home Exercise Plan Access code: [facility]           Patient will benefit from skilled therapeutic intervention in order to improve the following deficits and impairments:  Pain,Impaired flexibility,Decreased strength,Decreased activity tolerance,Increased edema,Decreased range of motion,Abnormal gait,Decreased balance,Difficulty walking,Hypomobility  Visit Diagnosis: Closed fracture of shaft of left tibia with routine healing, unspecified fracture morphology, subsequent encounter  Pain in left lower leg  Stiffness of left ankle, not elsewhere classified  Localized edema  Difficulty in walking, not elsewhere classified     Problem List Patient Active Problem List   Diagnosis Date Noted  . Closed fracture  of left tibia 02/01/2020  . Raynaud's phenomenon 11/05/2014  . Toe pain, bilateral 09/04/2014  . DOE (dyspnea on exertion) 09/04/2014  .  Chronic venous insufficiency 05/18/2014  . 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 [place], [place], [address] Phone: [phone]   Fax:  [phone]  Name: Armend Hochstatter MRN: [id number] Date of Birth: 22-May-1960

## 2020-04-04 ENCOUNTER — Ambulatory Visit: Payer: Medicaid Other | Admitting: Physical Therapy

## 2020-04-04 ENCOUNTER — Encounter: Payer: Self-pay | Admitting: Physical Therapy

## 2020-04-04 ENCOUNTER — Other Ambulatory Visit: Payer: Self-pay

## 2020-04-04 DIAGNOSIS — M25672 Stiffness of left ankle, not elsewhere classified: Secondary | ICD-10-CM

## 2020-04-04 DIAGNOSIS — S82202D Unspecified fracture of shaft of left tibia, subsequent encounter for closed fracture with routine healing: Secondary | ICD-10-CM

## 2020-04-04 DIAGNOSIS — R600 Localized edema: Secondary | ICD-10-CM

## 2020-04-04 DIAGNOSIS — R262 Difficulty in walking, not elsewhere classified: Secondary | ICD-10-CM

## 2020-04-04 DIAGNOSIS — M79662 Pain in left lower leg: Secondary | ICD-10-CM

## 2020-04-04 NOTE — Therapy (Signed)
[facility] [address] [place], [place], [address] Phone: [phone]   Fax:  [phone]  Physical Therapy Treatment  Patient Details  Name: Vincent Heath MRN: [id number] Date of Birth: Jun 25, 1960 Referring Provider (PT): Ramond Marrow, MD   Encounter Date: 04/04/2020   PT End of Session - 04/04/20 1500    Visit Number 3    Number of Visits 12    Date for PT Re-Evaluation 05/09/20    Authorization Type [facility] MCD-requesting authorization for initial 3 treatment visits    Authorization Time Period 04/02/20-06/02/19    PT Start Time 1414    PT Stop Time 1453    PT Time Calculation (min) 39 min    Activity Tolerance Patient tolerated treatment well    Behavior During Therapy [facility] for tasks assessed/performed           Past Medical History:  Diagnosis Date   Cancer ([facility])    cheek    Past Surgical History:  Procedure Laterality Date   ABDOMINAL AORTAGRAM N/A 06/14/2014   Procedure: ABDOMINAL Ronny Flurry;  Surgeon: Fransisco Hertz, MD;  Location: [facility] CATH LAB;  Service: Cardiovascular;  Laterality: N/A;   cheek surgery     TIBIA IM NAIL INSERTION Left 02/01/2020   Procedure: INTRAMEDULLARY (IM) NAIL TIBIAL;  Surgeon: Bjorn Pippin, MD;  Location: [facility] OR;  Service: Orthopedics;  Laterality: Left;    There were no vitals filed for this visit.   Subjective Assessment - 04/04/20 1431    Subjective Patient reports minor medial pain at times. He othersie feels like it is doing very well. he has the pain when he walkds for too long.    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 No/denies    Pain Onset More than a month ago    Pain Frequency Intermittent                             [facility] Adult PT Treatment/Exercise  - 04/04/20 0001      Knee/Hip Exercises: Stretches   Other Knee/Hip Stretches seated DF stretch with towel x 3 for 30 seconds      Knee/Hip Exercises: Seated   Long Arc Quad 20 reps    Other Seated Knee/Hip Exercises towel scuntches x20;    Hamstring Curl 20 reps    Hamstring Limitations red band      Knee/Hip Exercises: Supine   Straight Leg Raises 20 reps      Knee/Hip Exercises: Sidelying   Hip ABduction 20 reps      Manual Therapy   Manual therapy comments passive ankle 4 way stretching      Ankle Exercises: Seated   Ankle Circles/Pumps 5 reps    Towel Crunch 5 reps    Heel Raises 10 reps    Toe Raise 10 reps    Other Seated Ankle Exercises seated Rocker board A/P x20; baps x20 cw and ccw ; towel scruntch x20    Other Seated Ankle Exercises ankle 4 way 2x20 red                  PT Education - 04/04/20 1433    Education Details reviewed tehcnique with ther-ex    Person(s) Educated Patient    Methods Explanation;Demonstration;Tactile  cues;Verbal cues    Comprehension Verbalized understanding;Returned demonstration;Verbal cues required;Tactile cues required            PT Short Term Goals - 03/28/20 1522      PT SHORT TERM GOAL #1   Title Independent with HEP    Baseline needs HEP    Time 3    Period Weeks    Status New    Target Date 04/18/20      PT SHORT TERM GOAL #2   Title Increase left ankle DF AROM at least 3-5 deg to improve toe clearance with gait    Baseline 2 deg    Time 3    Period Weeks    Status New    Target Date 04/18/20      PT SHORT TERM GOAL #3   Title Pt. to ambulate mod I with RW and CAM boot left LE with bilateral step-through gait with neutral left foot position    Baseline uses "step-to" gait pattern with left foot turned out    Time 3    Period Weeks    Status New    Target Date 04/18/20             PT Long Term Goals - 03/28/20 1524      PT LONG TERM GOAL #1   Title Left ankle strength grossly 4/5 or greater  to improve ankle stability for outdoor ambulation over uneven surfaces    Baseline MMTs not tested at eval given recent post-op status    Time 6    Period Weeks    Status New    Target Date 05/09/20      PT LONG TERM GOAL #2   Title Pt. to be able to perform community ambulation mod I with SPC vs. LRAD with left LE status for  boot vs. brace pending progression as allowed per MD at follow up visits    Baseline uses RW with CAM boot left LE    Time 6    Period Weeks    Status New    Target Date 05/09/20      PT LONG TERM GOAL #3   Title Pt. to tolerate standing for periods at least 30 min with left ankle pain 2/10 or less to improve standing tolerance to resume assisting his son at work    Baseline unable to work/assist son, limited standing tolerance    Time 6    Period Weeks    Status New    Target Date 05/09/20                 Plan - 04/04/20 1501    Clinical Impression Statement Patient is making great progress. After stretching it seemed like her DF was near end range. He tolerated ther-ex well. he reported no pain with ther-ex. He will see the MD on Jan 5th. If he is out of the boot we will progress him to weight bearing activity.    Personal Factors and Comorbidities Comorbidity 2    Comorbidities venous insufficiency, oral CA, Raynaud's    Examination-Activity Limitations Transfers;Dressing;Bathing;Lift;Squat;Stairs;Locomotion Level;Stand    Examination-Participation Restrictions Occupation;Laundry;Cleaning;Community Activity;Shop;Meal Prep    Stability/Clinical Decision Making Stable/Uncomplicated    Clinical Decision Making Low    Rehab Potential Good    PT Frequency 2x / week    PT Treatment/Interventions ADLs/Self Care Home Management;Cryotherapy;Moist Heat;Therapeutic exercise;Patient/family education;Manual techniques;Passive range of motion;Vasopneumatic Device;Functional mobility training;Gait training;Stair training;Therapeutic activities;Neuromuscular  re-education;Balance training;Taping    PT Next Visit Plan  ask Advance directive questions and update chart, no estim due to recent CA history and caution with any cryo due to Raynaud's, no dry needling due to history chronic venous insufficiency, pt. WBAT in CAM boot, work in seated ROM with BAPS, seated tilt board, Theraband ankle 4-way, stretch gastroc, gait training with CAM boot and RW progress to cane as safely able    PT Home Exercise Plan Access code: [facility]    Consulted and Agree with Plan of Care Patient           Patient will benefit from skilled therapeutic intervention in order to improve the following deficits and impairments:  Pain,Impaired flexibility,Decreased strength,Decreased activity tolerance,Increased edema,Decreased range of motion,Abnormal gait,Decreased balance,Difficulty walking,Hypomobility  Visit Diagnosis: Closed fracture of shaft of left tibia with routine healing, unspecified fracture morphology, subsequent encounter  Pain in left lower leg  Stiffness of left ankle, not elsewhere classified  Localized edema  Difficulty in walking, not elsewhere classified     Problem List Patient Active Problem List   Diagnosis Date Noted   Closed fracture of left tibia 02/01/2020   Raynaud's phenomenon 11/05/2014   Toe pain, bilateral 09/04/2014   DOE (dyspnea on exertion) 09/04/2014   Chronic venous insufficiency 05/18/2014   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

## 2020-04-17 ENCOUNTER — Other Ambulatory Visit: Payer: Self-pay

## 2020-04-17 ENCOUNTER — Encounter: Payer: Self-pay | Admitting: Physical Therapy

## 2020-04-17 ENCOUNTER — Ambulatory Visit: Payer: Medicaid Other | Attending: Orthopaedic Surgery | Admitting: Physical Therapy

## 2020-04-17 DIAGNOSIS — S82202D Unspecified fracture of shaft of left tibia, subsequent encounter for closed fracture with routine healing: Secondary | ICD-10-CM | POA: Insufficient documentation

## 2020-04-17 DIAGNOSIS — R600 Localized edema: Secondary | ICD-10-CM | POA: Diagnosis present

## 2020-04-17 DIAGNOSIS — M25672 Stiffness of left ankle, not elsewhere classified: Secondary | ICD-10-CM | POA: Insufficient documentation

## 2020-04-17 DIAGNOSIS — R262 Difficulty in walking, not elsewhere classified: Secondary | ICD-10-CM | POA: Diagnosis present

## 2020-04-17 DIAGNOSIS — M79662 Pain in left lower leg: Secondary | ICD-10-CM | POA: Insufficient documentation

## 2020-04-17 NOTE — Therapy (Signed)
[place], [place], [address] Phone: [phone]   Fax:  [phone]  Physical Therapy Treatment  Patient Details  Name: Vincent Heath MRN: [id number] Date of Birth: 05-31-1960 Referring Provider (PT): Ophelia Charter, MD   Encounter Date: 04/17/2020   PT End of Session - 04/17/20 0940    Visit Number 4    Number of Visits 12    Date for PT Re-Evaluation 05/09/20    Authorization Type [facility] MCD-10 visits    Authorization Time Period 04/02/20-06/02/19    Authorization - Visit Number 2    Authorization - Number of Visits 10    PT Start Time 0935    PT Stop Time 2820    PT Time Calculation (min) 39 min           Past Medical History:  Diagnosis Date  . 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 ABDOMINAL AORTAGRAM N/A 06/14/2014   Procedure: ABDOMINAL Maxcine Ham;  Surgeon: Conrad [place], MD;  Location: [facility] CATH LAB;  Service: Cardiovascular;  Laterality: N/A;  . cheek surgery    . TIBIA IM NAIL INSERTION Left 02/01/2020   Procedure: INTRAMEDULLARY (IM) NAIL TIBIAL;  Surgeon: Hiram Gash, MD;  Location: [place];  Service: Orthopedics;  Laterality: Left;    There were no vitals filed for this visit.   Subjective Assessment - 04/17/20 0941    Subjective Pt reports pain is a "little" and happens when he does some of his exercises. No pain right now.    Currently in Pain? No/denies              [facility] PT Assessment - 04/17/20 0001      AROM   Left Ankle Dorsiflexion 5                         [facility] Adult PT Treatment/Exercise - 04/17/20 0001      Knee/Hip Exercises: Stretches   Other Knee/Hip Stretches seated DF stretch with towel x 3 for 30 seconds      Knee/Hip Exercises: Seated   Long Arc Quad 20 reps    Long Arc Quad Limitations green    Other Seated Knee/Hip Exercises towel scuntches x20;    Hamstring Curl 20 reps    Hamstring  Limitations green band      Ankle Exercises: Seated   Ankle Circles/Pumps 5 reps    Towel Crunch 5 reps    Heel Raises 10 reps    Toe Raise 10 reps    BAPS Level 3    Other Seated Ankle Exercises seated Rocker board A/P x20; baps x20 cw and ccw ; towel scruntch x20    Other Seated Ankle Exercises ankle 4 way x 10 green                    PT Short Term Goals - 04/17/20 1002      PT SHORT TERM GOAL #1   Title Independent with HEP    Time 3    Period Weeks    Status Achieved      PT SHORT TERM GOAL #2   Title Increase left ankle DF AROM at least 3-5 deg to improve toe clearance with gait    Baseline 5 degreees AROM    Time 3    Period Weeks    Status Achieved  PT SHORT TERM GOAL #3   Title Pt. to ambulate mod I with RW and CAM boot left LE with bilateral step-through gait with neutral left foot position    Baseline steps through , needs cues for neutral position    Time 3    Period Weeks    Status Partially Met    Target Date 04/18/20             PT Long Term Goals - 03/28/20 1524      PT LONG TERM GOAL #1   Title Left ankle strength grossly 4/5 or greater to improve ankle stability for outdoor ambulation over uneven surfaces    Baseline MMTs not tested at eval given recent post-op status    Time 6    Period Weeks    Status New    Target Date 05/09/20      PT LONG TERM GOAL #2   Title Pt. to be able to perform community ambulation mod I with SPC vs. LRAD with left LE status for  boot vs. brace pending progression as allowed per MD at follow up visits    Baseline uses RW with CAM boot left LE    Time 6    Period Weeks    Status New    Target Date 05/09/20      PT LONG TERM GOAL #3   Title Pt. to tolerate standing for periods at least 30 min with left ankle pain 2/10 or less to improve standing tolerance to resume assisting his son at work    Baseline unable to work/assist son, limited standing tolerance    Time 6    Period Weeks    Status New     Target Date 05/09/20                 Plan - 04/17/20 1012    Clinical Impression Statement Pt reports pain in lateral lower leg when he walks in the boot with neutral LE so he turns his foot out. He reports no pain if he walks without boot however he has not been cleared by MD to do so. He is walking with improved step through  pattern in boot and RW. He is independent with HEP 2-3 x per day with cues to slow down and use control. DF AROM has improved. He has met or partially met all STGS. He reports MD appoinmtnet in Jan 18th. Increased reistance with open chain knee and ankle without pain.    PT Next Visit Plan ask Advance directive questions and update chart, no estim due to recent CA history and caution with any cryo due to Raynaud's, no dry needling due to history chronic venous insufficiency, pt. WBAT in CAM boot, work in seated ROM with BAPS, seated tilt board, Theraband ankle 4-way, stretch gastroc, gait training with CAM boot and RW progress to cane as safely able    PT Home Exercise Plan Access code: [facility]           Patient will benefit from skilled therapeutic intervention in order to improve the following deficits and impairments:  Pain,Impaired flexibility,Decreased strength,Decreased activity tolerance,Increased edema,Decreased range of motion,Abnormal gait,Decreased balance,Difficulty walking,Hypomobility  Visit Diagnosis: Closed fracture of shaft of left tibia with routine healing, unspecified fracture morphology, subsequent encounter  Pain in left lower leg  Stiffness of left ankle, not elsewhere classified  Difficulty in walking, not elsewhere classified  Localized edema     Problem List Patient Active Problem List   Diagnosis Date  Noted  . Closed fracture of left tibia 02/01/2020  . Raynaud's phenomenon 11/05/2014  . Toe pain, bilateral 09/04/2014  . DOE (dyspnea on exertion) 09/04/2014  . Chronic venous insufficiency 05/18/2014  . Digital arterial  occlusive disease ([place]) 05/18/2014    Dorene Ar, PTA 04/17/2020, 10:17 AM  [facility] [address] [place], [place], [address] Phone: [phone]   Fax:  [phone]  Name: Maycol Hoying MRN: [id number] Date of Birth: Sep 17, 1960

## 2020-04-20 ENCOUNTER — Ambulatory Visit: Payer: Medicaid Other | Admitting: Rehabilitative and Restorative Service Providers"

## 2020-04-20 ENCOUNTER — Encounter: Payer: Self-pay | Admitting: Rehabilitative and Restorative Service Providers"

## 2020-04-20 ENCOUNTER — Other Ambulatory Visit: Payer: Self-pay

## 2020-04-20 DIAGNOSIS — R262 Difficulty in walking, not elsewhere classified: Secondary | ICD-10-CM

## 2020-04-20 DIAGNOSIS — S82202D Unspecified fracture of shaft of left tibia, subsequent encounter for closed fracture with routine healing: Secondary | ICD-10-CM

## 2020-04-20 DIAGNOSIS — M25672 Stiffness of left ankle, not elsewhere classified: Secondary | ICD-10-CM

## 2020-04-20 DIAGNOSIS — R600 Localized edema: Secondary | ICD-10-CM

## 2020-04-20 DIAGNOSIS — M79662 Pain in left lower leg: Secondary | ICD-10-CM

## 2020-04-20 NOTE — Therapy (Signed)
[place], [place], [address] Phone: [phone]   Fax:  [phone]  Physical Therapy Treatment  Patient Details  Name: Vincent Heath MRN: [id number] Date of Birth: [birth year] Referring Provider (PT): Ophelia Charter, MD   Encounter Date: 04/20/2020   PT End of Session - 04/20/20 1044    Visit Number 5    Number of Visits 12    Date for PT Re-Evaluation 05/09/20    Authorization Type [employer] MCD-10 visits    PT Start Time 5102    PT Stop Time 5852    PT Time Calculation (min) 44 min    Activity Tolerance Patient tolerated treatment well;No increased pain    Behavior During Therapy WFL for tasks assessed/performed           Past Medical History:  Diagnosis Date  . 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 ABDOMINAL AORTAGRAM N/A 06/14/2014   Procedure: ABDOMINAL Maxcine Ham;  Surgeon: Conrad [place], MD;  Location: [facility] CATH LAB;  Service: Cardiovascular;  Laterality: N/A;  . cheek surgery    . TIBIA IM NAIL INSERTION Left 02/01/2020   Procedure: INTRAMEDULLARY (IM) NAIL TIBIAL;  Surgeon: Hiram Gash, MD;  Location: [place];  Service: Orthopedics;  Laterality: Left;    There were no vitals filed for this visit.   Subjective Assessment - 04/20/20 1040    Subjective No pain.    Currently in Pain? No/denies                             [facility] Adult PT Treatment/Exercise - 04/20/20 0001      Exercises   Exercises Knee/Hip      Knee/Hip Exercises: Supine   Straight Leg Raises 20 reps    Other Supine Knee/Hip Exercises SLR/hip abduct/adduct combo x 20, hip circles/SLR combo clockwise/counterclockwise x 20 each; heel slides x 20      Knee/Hip Exercises: Sidelying   Hip ABduction 20 reps      Knee/Hip Exercises: Prone   Other Prone Exercises L hamstring curls x 20; L hip extension x 20      Ankle Exercises: Seated   Other Seated Ankle  Exercises seated heel raise x 20; seated toe raise x 20; seated inversion/eversion x 20; seated toe splay x 20; seated toe crunches x 30; seated folding towel with foot and then unfolded it      Ankle Exercises: Supine   Other Supine Ankle Exercises circles clockwise/counterclockwise x 20; ankle alphabet x 1 set; GTB ankle 4 way x 20 each                    PT Short Term Goals - 04/17/20 1002      PT SHORT TERM GOAL #1   Title Independent with HEP    Time 3    Period Weeks    Status Achieved      PT SHORT TERM GOAL #2   Title Increase left ankle DF AROM at least 3-5 deg to improve toe clearance with gait    Baseline 5 degreees AROM    Time 3    Period Weeks    Status Achieved      PT SHORT TERM GOAL #3   Title Pt. to ambulate mod I with RW and CAM boot left LE with bilateral step-through gait with neutral left foot position  Baseline steps through , needs cues for neutral position    Time 3    Period Weeks    Status Partially Met    Target Date 04/18/20             PT Long Term Goals - 03/28/20 1524      PT LONG TERM GOAL #1   Title Left ankle strength grossly 4/5 or greater to improve ankle stability for outdoor ambulation over uneven surfaces    Baseline MMTs not tested at eval given recent post-op status    Time 6    Period Weeks    Status New    Target Date 05/09/20      PT LONG TERM GOAL #2   Title Pt. to be able to perform community ambulation mod I with SPC vs. LRAD with left LE status for  boot vs. brace pending progression as allowed per MD at follow up visits    Baseline uses RW with CAM boot left LE    Time 6    Period Weeks    Status New    Target Date 05/09/20      PT LONG TERM GOAL #3   Title Pt. to tolerate standing for periods at least 30 min with left ankle pain 2/10 or less to improve standing tolerance to resume assisting his son at work    Baseline unable to work/assist son, limited standing tolerance    Time 6    Period Weeks     Status New    Target Date 05/09/20                 Plan - 04/20/20 1045    Clinical Impression Statement Pt returns to MD 04/30/2020. Pt motivated to exercise. Pt would benefit from further PT for L LE strengthening with emphasis on ankle. Still in CAM boot WBAT.    PT Treatment/Interventions ADLs/Self Care Home Management;Cryotherapy;Moist Heat;Therapeutic exercise;Patient/family education;Manual techniques;Passive range of motion;Vasopneumatic Device;Functional mobility training;Gait training;Stair training;Therapeutic activities;Neuromuscular re-education;Balance training;Taping    PT Next Visit Plan Continue L LE strengthening with emphasis on ankle; Baps level 3, seated tilt board, GT with cam,continue to progress    Consulted and Agree with Plan of Care Patient   spoke with son about advance directives; explained why we were asking about it. Advanced Directives complete. Phamplet sent home with patient for son/daughter.          Patient will benefit from skilled therapeutic intervention in order to improve the following deficits and impairments:  Pain,Impaired flexibility,Decreased strength,Decreased activity tolerance,Increased edema,Decreased range of motion,Abnormal gait,Decreased balance,Difficulty walking,Hypomobility  Visit Diagnosis: Pain in left lower leg  Stiffness of left ankle, not elsewhere classified  Difficulty in walking, not elsewhere classified  Localized edema  Closed fracture of shaft of left tibia with routine healing, unspecified fracture morphology, subsequent encounter     Problem List Patient Active Problem List   Diagnosis Date Noted  . Closed fracture of left tibia 02/01/2020  . Raynaud's phenomenon 11/05/2014  . Toe pain, bilateral 09/04/2014  . DOE (dyspnea on exertion) 09/04/2014  . Chronic venous insufficiency 05/18/2014  . Digital arterial occlusive disease ([place]) 05/18/2014    Mats Jeanlouis, PT 04/20/2020, 11:26 AM  [facility] [address] [place], [place], [address] Phone: [phone]   Fax:  [phone]  Name: Vincent Heath MRN: [id number] Date of Birth: 1961-01-02

## 2020-04-27 ENCOUNTER — Ambulatory Visit: Payer: Medicaid Other | Admitting: Rehabilitative and Restorative Service Providers"

## 2020-04-27 ENCOUNTER — Other Ambulatory Visit: Payer: Self-pay

## 2020-04-27 ENCOUNTER — Encounter: Payer: Self-pay | Admitting: Rehabilitative and Restorative Service Providers"

## 2020-04-27 DIAGNOSIS — R600 Localized edema: Secondary | ICD-10-CM

## 2020-04-27 DIAGNOSIS — M25672 Stiffness of left ankle, not elsewhere classified: Secondary | ICD-10-CM

## 2020-04-27 DIAGNOSIS — M79662 Pain in left lower leg: Secondary | ICD-10-CM

## 2020-04-27 DIAGNOSIS — S82202D Unspecified fracture of shaft of left tibia, subsequent encounter for closed fracture with routine healing: Secondary | ICD-10-CM | POA: Diagnosis not present

## 2020-04-27 DIAGNOSIS — R262 Difficulty in walking, not elsewhere classified: Secondary | ICD-10-CM

## 2020-04-27 NOTE — Therapy (Signed)
[place], [place], [address] Phone: [phone]   Fax:  [phone]  Physical Therapy Treatment  Patient Details  Name: Vincent Heath MRN: [id number] Date of Birth: 03-02-1961 Referring Provider (PT): Ophelia Charter, MD   Encounter Date: 04/27/2020   PT End of Session - 04/27/20 0937    Visit Number 6    PT Start Time 0940    PT Stop Time 1025    PT Time Calculation (min) 45 min    Activity Tolerance Patient tolerated treatment well;No increased pain    Behavior During Therapy WFL for tasks assessed/performed           Past Medical History:  Diagnosis Date  . 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 ABDOMINAL AORTAGRAM N/A 06/14/2014   Procedure: ABDOMINAL Maxcine Ham;  Surgeon: Conrad [place], MD;  Location: [facility] CATH LAB;  Service: Cardiovascular;  Laterality: N/A;  . cheek surgery    . TIBIA IM NAIL INSERTION Left 02/01/2020   Procedure: INTRAMEDULLARY (IM) NAIL TIBIAL;  Surgeon: Hiram Gash, MD;  Location: [facility];  Service: Orthopedics;  Laterality: Left;    There were no vitals filed for this visit.   Subjective Assessment - 04/27/20 0942    Subjective No pain.    Currently in Pain? No/denies    Pain Score 0-No pain                             [facility] Adult PT Treatment/Exercise - 04/27/20 0001      Knee/Hip Exercises: Supine   Other Supine Knee/Hip Exercises ankle pump x 20, ankle circles CW/CCW x 20 each, ankle alphabet x 1 set capital letters, SLR L x 20, SLR/hip abdct combo x 20; SAQ 2 lbs x 20; ball squeeze x 20 with 5 sec hold      Knee/Hip Exercises: Sidelying   Other Sidelying Knee/Hip Exercises in R sidelying, L 2 lb hip abdct x 20, L clam shell x 20. In L sidelying, L hip adduction x 20      Knee/Hip Exercises: Prone   Other Prone Exercises L hamstring curl 2 lbs x 20; L hip ext 2 lbs x 20      Ankle Exercises: Supine    Other Supine Ankle Exercises GTB ankle 4 way x 20 each with PT verbal and tactile cues for correct motion      Ankle Exercises: Seated   Other Seated Ankle Exercises Baps level 3 forward/backward, inversion/eversion, circles CW/CCW x 30 each with pt difficulty with controlled supination but overall difficulty with circles both directions; marble pickup with pt difficulty initially but then able to perform with less difficulty                  PT Education - 04/27/20 1027    Education Details told pt if he has marbles or something similar at home he can do a similar marble pickup for foot intrinsic muscle strengthening at home.    Person(s) Educated Patient    Methods Explanation    Comprehension Verbalized understanding            PT Short Term Goals - 04/27/20 0956      PT SHORT TERM GOAL #3   Title Pt. to ambulate mod I with RW and CAM boot left LE with bilateral step-through gait with neutral left foot position  Status Partially Met             PT Long Term Goals - 03/28/20 1524      PT LONG TERM GOAL #1   Title Left ankle strength grossly 4/5 or greater to improve ankle stability for outdoor ambulation over uneven surfaces    Baseline MMTs not tested at eval given recent post-op status    Time 6    Period Weeks    Status New    Target Date 05/09/20      PT LONG TERM GOAL #2   Title Pt. to be able to perform community ambulation mod I with SPC vs. LRAD with left LE status for  boot vs. brace pending progression as allowed per MD at follow up visits    Baseline uses RW with CAM boot left LE    Time 6    Period Weeks    Status New    Target Date 05/09/20      PT LONG TERM GOAL #3   Title Pt. to tolerate standing for periods at least 30 min with left ankle pain 2/10 or less to improve standing tolerance to resume assisting his son at work    Baseline unable to work/assist son, limited standing tolerance    Time 6    Period Weeks    Status New    Target  Date 05/09/20                 Plan - 04/27/20 0953    Clinical Impression Statement Pt returns to MD 04/30/2020. Pt motivated to exercise. Pt would benefit from further PT for L LE strengthening with emphasis on ankle. Still in CAM boot WBAT.    PT Treatment/Interventions ADLs/Self Care Home Management;Cryotherapy;Moist Heat;Therapeutic exercise;Patient/family education;Manual techniques;Passive range of motion;Vasopneumatic Device;Functional mobility training;Gait training;Stair training;Therapeutic activities;Neuromuscular re-education;Balance training;Taping    PT Next Visit Plan Continue L LE strengthening with emphasis on ankle; Baps level 3, seated tilt board, GT with cam,continue to progress    Consulted and Agree with Plan of Care Patient           Patient will benefit from skilled therapeutic intervention in order to improve the following deficits and impairments:  Pain,Impaired flexibility,Decreased strength,Decreased activity tolerance,Increased edema,Decreased range of motion,Abnormal gait,Decreased balance,Difficulty walking,Hypomobility  Visit Diagnosis: Pain in left lower leg  Stiffness of left ankle, not elsewhere classified  Difficulty in walking, not elsewhere classified  Localized edema  Closed fracture of shaft of left tibia with routine healing, unspecified fracture morphology, subsequent encounter     Problem List Patient Active Problem List   Diagnosis Date Noted  . Closed fracture of left tibia 02/01/2020  . Raynaud's phenomenon 11/05/2014  . Toe pain, bilateral 09/04/2014  . DOE (dyspnea on exertion) 09/04/2014  . Chronic venous insufficiency 05/18/2014  . Digital arterial occlusive disease ([place]) 05/18/2014    Vincent Heath, PT 04/27/2020, 10:28 AM  [facility] [address] [place], [place], [address] Phone: [phone]   Fax:  [phone]  Name: Vincent Heath MRN: [id number] Date of  Birth: June 01, 1960

## 2020-05-07 ENCOUNTER — Encounter: Payer: Self-pay | Admitting: Physical Therapy

## 2020-05-07 ENCOUNTER — Ambulatory Visit: Payer: Medicaid Other | Admitting: Physical Therapy

## 2020-05-07 ENCOUNTER — Other Ambulatory Visit: Payer: Self-pay

## 2020-05-07 DIAGNOSIS — R600 Localized edema: Secondary | ICD-10-CM

## 2020-05-07 DIAGNOSIS — S82202D Unspecified fracture of shaft of left tibia, subsequent encounter for closed fracture with routine healing: Secondary | ICD-10-CM | POA: Diagnosis not present

## 2020-05-07 DIAGNOSIS — M25672 Stiffness of left ankle, not elsewhere classified: Secondary | ICD-10-CM

## 2020-05-07 DIAGNOSIS — M79662 Pain in left lower leg: Secondary | ICD-10-CM

## 2020-05-07 DIAGNOSIS — R262 Difficulty in walking, not elsewhere classified: Secondary | ICD-10-CM

## 2020-05-07 NOTE — Patient Instructions (Signed)
Access Code: [email or web address] URL: [email or web address] Date: 05/07/2020 Prepared by: Hessie Diener  Exercises ADDED:    Sit to stand at chair or sink - 1 x daily - 7 x weekly - 10 reps Standing March with Counter Support - 1 x daily - 7 x weekly - 2 sets - 10 reps Standing Hip Abduction with Counter Support - 1 x daily - 7 x weekly - 2 sets - 10 reps Standing Hip Extension with Unilateral Counter Support - 1 x daily - 7 x weekly - 2 sets - 10 reps

## 2020-05-07 NOTE — Therapy (Signed)
[place], [place], [address] Phone: [phone]   Fax:  [phone]  Physical Therapy Treatment  Patient Details  Name: Vincent Heath MRN: [id number] Date of Birth: Jan 13, 1961 Referring Provider (PT): Ophelia Charter, MD   Encounter Date: 05/07/2020   PT End of Session - 05/07/20 1242    Visit Number 7    Number of Visits 12    Date for PT Re-Evaluation 05/09/20    Authorization Type [employer] MCD-10 visits    Authorization Time Period 04/02/20-06/02/19    Authorization - Visit Number 5    Authorization - Number of Visits 10    PT Start Time 7253    PT Stop Time 1308    PT Time Calculation (min) 38 min    Activity Tolerance Patient tolerated treatment well;No increased pain    Behavior During Therapy WFL for tasks assessed/performed           Past Medical History:  Diagnosis Date  . Cancer ([place])    cheek    Past Surgical History:  Procedure Laterality Date  . ABDOMINAL AORTAGRAM N/A 06/14/2014   Procedure: ABDOMINAL Maxcine Ham;  Surgeon: Conrad [place], MD;  Location: [facility] CATH LAB;  Service: Cardiovascular;  Laterality: N/A;  . cheek surgery    . TIBIA IM NAIL INSERTION Left 02/01/2020   Procedure: INTRAMEDULLARY (IM) NAIL TIBIAL;  Surgeon: Hiram Gash, MD;  Location: [facility];  Service: Orthopedics;  Laterality: Left;    There were no vitals filed for this visit.   Subjective Assessment - 05/07/20 1239    Subjective Pt reports MD released him and he is to continue PT unitl discharged.    Currently in Pain? No/denies   up to 3/10   Pain Score 0-No pain    Pain Location Ankle    Pain Orientation Left    Pain Onset More than a month ago    Aggravating Factors  stading and walking prolonged    Pain Relieving Factors rest              [facility] PT Assessment - 05/07/20 0001      Balance   Balance Assessed Yes                         [facility] Adult PT Treatment/Exercise - 05/07/20  0001      Ambulation/Gait   Ambulation Distance (Feet) 200 Feet    Assistive device Rolling walker    Gait Comments pt ambulates with RW Mod I, needs cues for neutral LLE with gait      Knee/Hip Exercises: Stretches   Other Knee/Hip Stretches --      Knee/Hip Exercises: Aerobic   Recumbent Bike L2 x 5 minutes      Knee/Hip Exercises: Standing   Other Standing Knee Exercises 3 way hip and RW x 10 each , alternating marching at RW , sit-stands x 10 cues for neutral LE      Knee/Hip Exercises: Supine   Other Supine Knee/Hip Exercises ankle pumps, circles, L SLR                  PT Education - 05/07/20 1351    Education Details HEP    Person(s) Educated Patient    Methods Explanation;Handout    Comprehension Verbalized understanding            PT Short Term Goals - 05/07/20 1351      PT SHORT TERM  GOAL #1   Title Independent with HEP    Baseline compliant with HEP frequency and reps    Time 3    Period Weeks    Status Achieved    Target Date 04/18/20      PT SHORT TERM GOAL #2   Title Increase left ankle DF AROM at least 3-5 deg to improve toe clearance with gait    Baseline 5 degreees AROM    Time 3    Period Weeks    Target Date 04/18/20      PT SHORT TERM GOAL #3   Title Pt. to ambulate mod I with RW and CAM boot left LE with bilateral step-through gait with neutral left foot position    Baseline steps through , needs cues for neutral position, CAM boot is discontinued -05/07/20    Time 3    Period Weeks    Status Partially Met    Target Date 04/18/20             PT Long Term Goals - 03/28/20 1524      PT LONG TERM GOAL #1   Title Left ankle strength grossly 4/5 or greater to improve ankle stability for outdoor ambulation over uneven surfaces    Baseline MMTs not tested at eval given recent post-op status    Time 6    Period Weeks    Status New    Target Date 05/09/20      PT LONG TERM GOAL #2   Title Pt. to be able to perform community  ambulation mod I with SPC vs. LRAD with left LE status for  boot vs. brace pending progression as allowed per MD at follow up visits    Baseline uses RW with CAM boot left LE    Time 6    Period Weeks    Status New    Target Date 05/09/20      PT LONG TERM GOAL #3   Title Pt. to tolerate standing for periods at least 30 min with left ankle pain 2/10 or less to improve standing tolerance to resume assisting his son at work    Baseline unable to work/assist son, limited standing tolerance    Time 6    Period Weeks    Status New    Target Date 05/09/20                 Plan - 05/07/20 1251    Clinical Impression Statement Pt arrives with RW and in regular shoes, reports the CAM boot was discontinued by MD and he has been released to continue PT until discharge. Began weight bearing exercise with pain increase to 3/10. He was issued an updated closed chain HEP.    PT Next Visit Plan check response to newest HEP, closed chain, gait and balance as tolerated    PT Home Exercise Plan Access code: [facility]           Patient will benefit from skilled therapeutic intervention in order to improve the following deficits and impairments:  Pain,Impaired flexibility,Decreased strength,Decreased activity tolerance,Increased edema,Decreased range of motion,Abnormal gait,Decreased balance,Difficulty walking,Hypomobility  Visit Diagnosis: Pain in left lower leg  Stiffness of left ankle, not elsewhere classified  Difficulty in walking, not elsewhere classified  Localized edema  Closed fracture of shaft of left tibia with routine healing, unspecified fracture morphology, subsequent encounter     Problem List Patient Active Problem List   Diagnosis Date Noted  . Closed fracture of left tibia 02/01/2020  .  Raynaud's phenomenon 11/05/2014  . Toe pain, bilateral 09/04/2014  . DOE (dyspnea on exertion) 09/04/2014  . Chronic venous insufficiency 05/18/2014  . Digital arterial occlusive  disease ([place]) 05/18/2014    Dorene Ar, PTA 05/07/2020, 1:57 PM  [facility] [address] [place], [place], [address] Phone: [phone]   Fax:  [phone]  Name: Vincent Heath MRN: [id number] Date of Birth: 09-02-1960

## 2020-05-09 ENCOUNTER — Other Ambulatory Visit: Payer: Self-pay

## 2020-05-09 ENCOUNTER — Encounter: Payer: Self-pay | Admitting: Physical Therapy

## 2020-05-09 ENCOUNTER — Ambulatory Visit: Payer: Medicaid Other | Admitting: Physical Therapy

## 2020-05-09 DIAGNOSIS — M25672 Stiffness of left ankle, not elsewhere classified: Secondary | ICD-10-CM

## 2020-05-09 DIAGNOSIS — R262 Difficulty in walking, not elsewhere classified: Secondary | ICD-10-CM

## 2020-05-09 DIAGNOSIS — R600 Localized edema: Secondary | ICD-10-CM

## 2020-05-09 DIAGNOSIS — S82202D Unspecified fracture of shaft of left tibia, subsequent encounter for closed fracture with routine healing: Secondary | ICD-10-CM

## 2020-05-09 DIAGNOSIS — M79662 Pain in left lower leg: Secondary | ICD-10-CM

## 2020-05-09 NOTE — Therapy (Signed)
[place], [place], [address] Phone: [phone]   Fax:  [phone]  Physical Therapy Treatment/Recertification  Patient Details  Name: Vincent Heath MRN: [id number] Date of Birth: 10-12-1960 Referring Provider (PT): Ophelia Charter, MD   Encounter Date: 05/09/2020   PT End of Session - 05/09/20 1412    Visit Number 8    Number of Visits 19    Date for PT Re-Evaluation 06/20/20    Authorization Type [employer] MCD-10 visits    Authorization Time Period 04/02/20-06/02/19    Authorization - Visit Number 7    Authorization - Number of Visits 10    PT Start Time 1332    PT Stop Time 1411    PT Time Calculation (min) 39 min    Equipment Utilized During Treatment Gait belt    Activity Tolerance Patient tolerated treatment well    Behavior During Therapy [facility] for tasks assessed/performed           Past Medical History:  Diagnosis Date  . 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 ABDOMINAL AORTAGRAM N/A 06/14/2014   Procedure: ABDOMINAL Maxcine Ham;  Surgeon: Conrad Kingston, MD;  Location: [facility] CATH LAB;  Service: Cardiovascular;  Laterality: N/A;  . cheek surgery    . TIBIA IM NAIL INSERTION Left 02/01/2020   Procedure: INTRAMEDULLARY (IM) NAIL TIBIAL;  Surgeon: Hiram Gash, MD;  Location: [place];  Service: Orthopedics;  Laterality: Left;    There were no vitals filed for this visit.   Subjective Assessment - 05/09/20 1334    Subjective No pain pre-tx. today. As noted last visit patient now out of CAM boot and wearing regular shoes but continues with gait difficulties with use/need RW to ambulate.    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 No/denies              [facility] PT Assessment - 05/09/20 0001      AROM   Left Ankle Dorsiflexion 6    Left Ankle Plantar Flexion 45    Left Ankle Inversion 12    Left Ankle Eversion 10      Strength   Strength Assessment Site Knee    Right/Left Knee Left    Left Knee Flexion 5/5    Left Knee Extension 4+/5    Left Ankle Dorsiflexion 4+/5    Left Ankle Plantar Flexion 4/5   supine only   Left Ankle Inversion 4/5    Left Ankle Eversion 4+/5                         [facility] Adult PT Treatment/Exercise - 05/09/20 0001      Ambulation/Gait   Gait Comments Gait training 370 feet without AD with CGA-cues heel strike and for neutral foot position      Knee/Hip Exercises: Aerobic   Recumbent Bike L2 x 5 minutes      Knee/Hip Exercises: Standing   Lateral Step Up Left;1 set;15 reps;Hand Hold: 0;Step Height: 4"    Forward Step Up Left;1 set;15 reps;Hand Hold: 0;Step Height: 4"    Functional Squat 15 reps    Functional Squat Limitations partial squat at counter  Ankle Exercises: Standing   Rocker Board 2 minutes   dynamic balance 1 min ea. lateral and fw/rev   Heel Raises 15 reps    Other Standing Ankle Exercises Airex marches x 15 reps, Romberg eyes closed 20 sec x 3 on Airex      Ankle Exercises: Stretches   Gastroc Stretch 3 reps;30 seconds   standing stretch left side at counter     Ankle Exercises: Seated   BAPS Level 2;15 reps    Other Seated Ankle Exercises towel IV/EV and toe scrunches x 20 ea.                  PT Education - 05/09/20 1416    Education Details gait training, OK to start weaning off RW use at home as safely able but continue to use RW for community mobility, exercises    Person(s) Educated Patient    Methods Explanation;Demonstration;Handout;Verbal cues    Comprehension Verbalized understanding;Returned demonstration;Verbal cues required;Need further instruction            PT Short Term Goals -  05/09/20 1422      PT SHORT TERM GOAL #1   Title Independent with HEP    Baseline met-recently updated to include closed chain activities    Time 3    Period Weeks    Status Achieved      PT SHORT TERM GOAL #2   Title Increase left ankle DF AROM at least 3-5 deg to improve toe clearance with gait    Baseline 6 dge AROM    Time 3    Period Weeks    Status Achieved      PT SHORT TERM GOAL #3   Title Pt. to ambulate mod I with RW and CAM boot left LE with bilateral step-through gait with neutral left foot position    Baseline steps through , needs cues for neutral position, CAM boot is discontinued -05/07/20    Time 3    Period Weeks    Status Partially Met             PT Long Term Goals - 05/09/20 1423      PT LONG TERM GOAL #1   Title Left ankle strength grossly 5/5 to improve ankle stability for outdoor ambulation over uneven surfaces    Baseline initial goal for 4/5 strength met-update goal for 5/5 ankle strength    Time 6    Period Weeks    Status Revised    Target Date 06/20/20      PT LONG TERM GOAL #2   Title Pt. to be able to perform community ambulation mod I with SPC vs. LRAD with left LE status for  boot vs. brace pending progression as allowed per MD at follow up visits    Baseline uses RW, CAM boot discontinued    Time 6    Period Weeks    Status On-going    Target Date 06/20/20      PT LONG TERM GOAL #3   Title Pt. to tolerate standing for periods at least 30 min with left ankle pain 2/10 or less to improve standing tolerance to resume assisting his son at work    Baseline met for standing time but pain >2/10    Time 6    Period Weeks    Status Partially Met    Target Date 06/20/20                 Plan - 05/09/20  11    Clinical Impression Statement Pt. presents for 8th therapy visit today >3 months s/p IM nail for left tibial fracture and recently cleared to d/c CAM boot. Progressed gait training today for ambulation without RW with pt.  able to ambulate shotr distances in clinic between exercises with SBA-supervision but required CGA for more prolonged distance as noted per flowsheet. He has tendency to externally rotate/abduct left foot and decreased heel strike and toe off requiring cues for gait mechanics to address. Still with some ankle stiffness but ankle DF and PF in particular improving from baseline status. Also checked MMTs today with ankle and quadricep weakness noted. Plan continue PT for further progress to improve functional status for mobility and assist return to PLOF.    Personal Factors and Comorbidities Comorbidity 2    Comorbidities venous insufficiency, oral CA, Raynaud's    Examination-Activity Limitations Transfers;Dressing;Bathing;Lift;Squat;Stairs;Locomotion Level;Stand    Examination-Participation Restrictions Occupation;Laundry;Cleaning;Community Activity;Shop;Meal Prep    Stability/Clinical Decision Making Stable/Uncomplicated    Clinical Decision Making Low    Rehab Potential Good    PT Frequency 2x / week    PT Duration 6 weeks    PT Treatment/Interventions ADLs/Self Care Home Management;Cryotherapy;Moist Heat;Therapeutic exercise;Patient/family education;Manual techniques;Passive range of motion;Vasopneumatic Device;Functional mobility training;Gait training;Stair training;Therapeutic activities;Neuromuscular re-education;Balance training;Taping    PT Next Visit Plan Continue gait training for progression to wean off RW, closed chain strengthening, balance/proprioceptive challenges, ankle ROM and stretches    PT Home Exercise Plan Access code: [email or web address]    Consulted and Agree with Plan of Care Patient           Patient will benefit from skilled therapeutic intervention in order to improve the following deficits and impairments:  Pain,Impaired flexibility,Decreased strength,Decreased activity tolerance,Increased edema,Decreased range of motion,Abnormal gait,Decreased balance,Difficulty  walking,Hypomobility  Visit Diagnosis: Pain in left lower leg  Stiffness of left ankle, not elsewhere classified  Difficulty in walking, not elsewhere classified  Localized edema  Closed fracture of shaft of left tibia with routine healing, unspecified fracture morphology, subsequent encounter     Problem List Patient Active Problem List   Diagnosis Date Noted  . Closed fracture of left tibia 02/01/2020  . Raynaud's phenomenon 11/05/2014  . Toe pain, bilateral 09/04/2014  . DOE (dyspnea on exertion) 09/04/2014  . Chronic venous insufficiency 05/18/2014  . 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

## 2020-05-14 ENCOUNTER — Ambulatory Visit: Payer: Medicaid Other | Attending: Orthopaedic Surgery | Admitting: Physical Therapy

## 2020-05-14 ENCOUNTER — Other Ambulatory Visit: Payer: Self-pay

## 2020-05-14 ENCOUNTER — Encounter: Payer: Self-pay | Admitting: Physical Therapy

## 2020-05-14 DIAGNOSIS — M25672 Stiffness of left ankle, not elsewhere classified: Secondary | ICD-10-CM

## 2020-05-14 DIAGNOSIS — R600 Localized edema: Secondary | ICD-10-CM | POA: Diagnosis present

## 2020-05-14 DIAGNOSIS — S82202D Unspecified fracture of shaft of left tibia, subsequent encounter for closed fracture with routine healing: Secondary | ICD-10-CM | POA: Diagnosis present

## 2020-05-14 DIAGNOSIS — R262 Difficulty in walking, not elsewhere classified: Secondary | ICD-10-CM

## 2020-05-14 DIAGNOSIS — M79662 Pain in left lower leg: Secondary | ICD-10-CM | POA: Diagnosis not present

## 2020-05-14 NOTE — Therapy (Signed)
[place], [place], [address] Phone: [phone]   Fax:  [phone]  Physical Therapy Treatment  Patient Details  Name: Vincent Heath MRN: [id number] Date of Birth: 1960-07-09 Referring Provider (PT): Ophelia Charter, MD   Encounter Date: 05/14/2020   PT End of Session - 05/14/20 1021    Visit Number 9    Number of Visits 19    Date for PT Re-Evaluation 06/20/20    Authorization Type [employer] MCD-10 visits    Authorization Time Period 04/02/20-06/02/19    Authorization - Visit Number 8    Authorization - Number of Visits 10    PT Start Time 1017    PT Stop Time 1057    PT Time Calculation (min) 40 min    Activity Tolerance Patient tolerated treatment well    Behavior During Therapy [facility] for tasks assessed/performed           Past Medical History:  Diagnosis Date  . 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 ABDOMINAL AORTAGRAM N/A 06/14/2014   Procedure: ABDOMINAL Maxcine Ham;  Surgeon: Conrad [place], MD;  Location: [facility] CATH LAB;  Service: Cardiovascular;  Laterality: N/A;  . cheek surgery    . TIBIA IM NAIL INSERTION Left 02/01/2020   Procedure: INTRAMEDULLARY (IM) NAIL TIBIAL;  Surgeon: Hiram Gash, MD;  Location: [place];  Service: Orthopedics;  Laterality: Left;    There were no vitals filed for this visit.   Subjective Assessment - 05/14/20 1020    Subjective No major soreness after last visit though some left gastroc soreness after prolonged standing at work.    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 No/denies                             [facility] Adult PT Treatment/Exercise - 05/14/20 0001      Knee/Hip Exercises: Standing   Lateral Step Up Left;2 sets;10 reps;Step Height: 4"    Lateral Step Up Limitations  intermittent hand support on counter    Forward Step Up Left;2 sets;10 reps;Step Height: 6"    Forward Step Up Limitations intermittent hand support on counter    Functional Squat 20 reps    Functional Squat Limitations partial squat at counter      Manual Therapy   Manual Therapy Joint mobilization;Soft tissue mobilization    Joint Mobilization left talocrural AP mobilization grade I-III    Soft tissue mobilization STM/IASTM with roller left lateral gastoc and peroneals      Ankle Exercises: Aerobic   Nustep L4 x 5 min UE/LE      Ankle Exercises: Standing   Heel Raises 20 reps   on Airex   Other Standing Ankle Exercises Romberg eyes closed feet apart on Airex 20 sec x 3      Ankle Exercises: Stretches   Gastroc Stretch 3 reps;30 seconds   supine manual stretch   Slant Board Stretch 3 reps;30 seconds      Ankle Exercises: Seated   BAPS Level 2;15 reps                  PT Education - 05/14/20 1238    Education Details exercises    Person(s) Educated Patient    Methods Explanation;Demonstration;Verbal cues    Comprehension  Verbalized understanding;Returned demonstration;Verbal cues required;Need further instruction            PT Short Term Goals - 05/09/20 1422      PT SHORT TERM GOAL #1   Title Independent with HEP    Baseline met-recently updated to include closed chain activities    Time 3    Period Weeks    Status Achieved      PT SHORT TERM GOAL #2   Title Increase left ankle DF AROM at least 3-5 deg to improve toe clearance with gait    Baseline 6 dge AROM    Time 3    Period Weeks    Status Achieved      PT SHORT TERM GOAL #3   Title Pt. to ambulate mod I with RW and CAM boot left LE with bilateral step-through gait with neutral left foot position    Baseline steps through , needs cues for neutral position, CAM boot is discontinued -05/07/20    Time 3    Period Weeks    Status Partially Met             PT Long Term Goals - 05/09/20 1423       PT LONG TERM GOAL #1   Title Left ankle strength grossly 5/5 to improve ankle stability for outdoor ambulation over uneven surfaces    Baseline initial goal for 4/5 strength met-update goal for 5/5 ankle strength    Time 6    Period Weeks    Status Revised    Target Date 06/20/20      PT LONG TERM GOAL #2   Title Pt. to be able to perform community ambulation mod I with SPC vs. LRAD with left LE status for  boot vs. brace pending progression as allowed per MD at follow up visits    Baseline uses RW, CAM boot discontinued    Time 6    Period Weeks    Status On-going    Target Date 06/20/20      PT LONG TERM GOAL #3   Title Pt. to tolerate standing for periods at least 30 min with left ankle pain 2/10 or less to improve standing tolerance to resume assisting his son at work    Baseline met for standing time but pain >2/10    Time 6    Period Weeks    Status Partially Met    Target Date 06/20/20                 Plan - 05/14/20 1239    Clinical Impression Statement Continued work on closed chain strengthening and balance with pt. improving but still showing muscle weakness including decreased eccentric quad control with step down motions. Pt. had local muscle tightness with trigger point left lateral gastroc region so included STM to help address and also added gentle ankle mobilizations to help address tightness and limitations in ankle DF. Functionally still reliant on RW for more prolonged mobility so plan continued PT to address gait limitations and assist return to PLOF.    Personal Factors and Comorbidities Comorbidity 2    Comorbidities venous insufficiency, oral CA, Raynaud's    Examination-Activity Limitations Transfers;Dressing;Bathing;Lift;Squat;Stairs;Locomotion Level;Stand    Examination-Participation Restrictions Occupation;Laundry;Cleaning;Community Activity;Shop;Meal Prep    Stability/Clinical Decision Making Stable/Uncomplicated    Clinical Decision Making Low     Rehab Potential Good    PT Frequency 2x / week    PT Duration 6 weeks    PT Treatment/Interventions ADLs/Self Care  Home Management;Cryotherapy;Moist Heat;Therapeutic exercise;Patient/family education;Manual techniques;Passive range of motion;Vasopneumatic Device;Functional mobility training;Gait training;Stair training;Therapeutic activities;Neuromuscular re-education;Balance training;Taping    PT Next Visit Plan Continue gait training for progression to wean off RW, closed chain strengthening, balance/proprioceptive challenges, ankle ROM and stretches, manual prn    PT Home Exercise Plan Access code: [facility]           Patient will benefit from skilled therapeutic intervention in order to improve the following deficits and impairments:  Pain,Impaired flexibility,Decreased strength,Decreased activity tolerance,Increased edema,Decreased range of motion,Abnormal gait,Decreased balance,Difficulty walking,Hypomobility  Visit Diagnosis: Pain in left lower leg  Stiffness of left ankle, not elsewhere classified  Difficulty in walking, not elsewhere classified  Localized edema  Closed fracture of shaft of left tibia with routine healing, unspecified fracture morphology, subsequent encounter     Problem List Patient Active Problem List   Diagnosis Date Noted  . Closed fracture of left tibia 02/01/2020  . Raynaud's phenomenon 11/05/2014  . Toe pain, bilateral 09/04/2014  . DOE (dyspnea on exertion) 09/04/2014  . Chronic venous insufficiency 05/18/2014  . 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

## 2020-05-16 ENCOUNTER — Other Ambulatory Visit: Payer: Self-pay

## 2020-05-16 ENCOUNTER — Encounter: Payer: Self-pay | Admitting: Physical Therapy

## 2020-05-16 ENCOUNTER — Ambulatory Visit: Payer: Medicaid Other | Admitting: Physical Therapy

## 2020-05-16 DIAGNOSIS — M79662 Pain in left lower leg: Secondary | ICD-10-CM | POA: Diagnosis not present

## 2020-05-16 DIAGNOSIS — R600 Localized edema: Secondary | ICD-10-CM

## 2020-05-16 DIAGNOSIS — S82202D Unspecified fracture of shaft of left tibia, subsequent encounter for closed fracture with routine healing: Secondary | ICD-10-CM

## 2020-05-16 DIAGNOSIS — M25672 Stiffness of left ankle, not elsewhere classified: Secondary | ICD-10-CM

## 2020-05-16 DIAGNOSIS — R262 Difficulty in walking, not elsewhere classified: Secondary | ICD-10-CM

## 2020-05-16 NOTE — Therapy (Signed)
[place], [place], [address] Phone: [phone]   Fax:  [phone]  Physical Therapy Treatment  Patient Details  Name: Vincent Heath MRN: [id number] Date of Birth: [birth year] Referring Provider (PT): Ophelia Charter, MD   Encounter Date: 05/16/2020   PT End of Session - 05/16/20 1111    Visit Number 10    Number of Visits 19    Date for PT Re-Evaluation 06/20/20    Authorization Type [employer] MCD-10 visits    Authorization Time Period 04/02/20-06/02/19    Authorization - Visit Number 9    Authorization - Number of Visits 10    PT Start Time 2119    PT Stop Time 1055    PT Time Calculation (min) 40 min           Past Medical History:  Diagnosis Date  . 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 ABDOMINAL AORTAGRAM N/A 06/14/2014   Procedure: ABDOMINAL Maxcine Ham;  Surgeon: Conrad [place], MD;  Location: [facility] CATH LAB;  Service: Cardiovascular;  Laterality: N/A;  . cheek surgery    . TIBIA IM NAIL INSERTION Left 02/01/2020   Procedure: INTRAMEDULLARY (IM) NAIL TIBIAL;  Surgeon: Hiram Gash, MD;  Location: [place];  Service: Orthopedics;  Laterality: Left;    There were no vitals filed for this visit.   Subjective Assessment - 05/16/20 1021    Subjective Pain 1/10 sometimes. I was able to walk into [employer] without RW and used a cart. I can go up and down the stairs normally.    Currently in Pain? No/denies                             [facility] Adult PT Treatment/Exercise - 05/16/20 0001      Ambulation/Gait   Gait Comments tandem gait, retro gait, side stepping with light intermittent touch as needed in parallal bars- 4 bouts each      Knee/Hip Exercises: Standing   Lateral Step Up Left;2 sets;10 reps;Step Height: 6"    Lateral Step Up Limitations intermittent hand support on counter    Forward Step Up Left;2 sets;10 reps;Step Height: 6"     Forward Step Up Limitations intermittent hand support on counter    Functional Squat --    Functional Squat Limitations --    Other Standing Knee Exercises 3 way hip from airex pad SLS left 10 x 2 each-= light touch      Knee/Hip Exercises: Seated   Sit to Sand 20 reps      Ankle Exercises: Supine   Other Supine Ankle Exercises GTB ankle 4 way x 20 each with PT verbal and tactile cues for correct motion      Ankle Exercises: Standing   Rocker Board 2 minutes   dynamic balance 1 min ea. lateral and fw/rev   Heel Raises 20 reps    Toe Raise 20 reps    Other Standing Ankle Exercises Romberg eyes closed on Airex 20 sec x 3    Other Standing Ankle Exercises ste on and off airex forward and retro      Ankle Exercises: Stretches   Slant Board Stretch 3 reps;30 seconds                    PT Short Term Goals - 05/09/20 1422      PT SHORT TERM  GOAL #1   Title Independent with HEP    Baseline met-recently updated to include closed chain activities    Time 3    Period Weeks    Status Achieved      PT SHORT TERM GOAL #2   Title Increase left ankle DF AROM at least 3-5 deg to improve toe clearance with gait    Baseline 6 dge AROM    Time 3    Period Weeks    Status Achieved      PT SHORT TERM GOAL #3   Title Pt. to ambulate mod I with RW and CAM boot left LE with bilateral step-through gait with neutral left foot position    Baseline steps through , needs cues for neutral position, CAM boot is discontinued -05/07/20    Time 3    Period Weeks    Status Partially Met             PT Long Term Goals - 05/09/20 1423      PT LONG TERM GOAL #1   Title Left ankle strength grossly 5/5 to improve ankle stability for outdoor ambulation over uneven surfaces    Baseline initial goal for 4/5 strength met-update goal for 5/5 ankle strength    Time 6    Period Weeks    Status Revised    Target Date 06/20/20      PT LONG TERM GOAL #2   Title Pt. to be able to perform community  ambulation mod I with SPC vs. LRAD with left LE status for  boot vs. brace pending progression as allowed per MD at follow up visits    Baseline uses RW, CAM boot discontinued    Time 6    Period Weeks    Status On-going    Target Date 06/20/20      PT LONG TERM GOAL #3   Title Pt. to tolerate standing for periods at least 30 min with left ankle pain 2/10 or less to improve standing tolerance to resume assisting his son at work    Baseline met for standing time but pain >2/10    Time 6    Period Weeks    Status Partially Met    Target Date 06/20/20                 Plan - 05/16/20 1109    Clinical Impression Statement Continued with clsed chain strength and balance as well as gait. He reports 1/10 pain intermittently when in SLS positions on left. Arrived with RW however able to ambulate around clinic without AD. He continues to ambulate with LLE turned out and requires cues for more neutral alignment.    PT Next Visit Plan Continue gait training for progression to wean off RW, closed chain strengthening, balance/proprioceptive challenges, ankle ROM and stretches, manual prn    PT Home Exercise Plan Access code: [email or web address]    Consulted and Agree with Plan of Care Patient           Patient will benefit from skilled therapeutic intervention in order to improve the following deficits and impairments:  Pain,Impaired flexibility,Decreased strength,Decreased activity tolerance,Increased edema,Decreased range of motion,Abnormal gait,Decreased balance,Difficulty walking,Hypomobility  Visit Diagnosis: Pain in left lower leg  Stiffness of left ankle, not elsewhere classified  Difficulty in walking, not elsewhere classified  Localized edema  Closed fracture of shaft of left tibia with routine healing, unspecified fracture morphology, subsequent encounter     Problem List Patient Active Problem List  Diagnosis Date Noted  . Closed fracture of left tibia 02/01/2020  . Raynaud's  phenomenon 11/05/2014  . Toe pain, bilateral 09/04/2014  . DOE (dyspnea on exertion) 09/04/2014  . Chronic venous insufficiency 05/18/2014  . 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 [place], [place], [address] Phone: [phone]   Fax:  [phone]  Name: Vincent Heath MRN: [id number] Date of Birth: [birth year]

## 2020-05-30 ENCOUNTER — Other Ambulatory Visit: Payer: Self-pay

## 2020-05-30 ENCOUNTER — Ambulatory Visit: Payer: Medicaid Other | Admitting: Physical Therapy

## 2020-05-30 ENCOUNTER — Encounter: Payer: Self-pay | Admitting: Physical Therapy

## 2020-05-30 DIAGNOSIS — S82202D Unspecified fracture of shaft of left tibia, subsequent encounter for closed fracture with routine healing: Secondary | ICD-10-CM

## 2020-05-30 DIAGNOSIS — M79662 Pain in left lower leg: Secondary | ICD-10-CM

## 2020-05-30 DIAGNOSIS — R600 Localized edema: Secondary | ICD-10-CM

## 2020-05-30 DIAGNOSIS — R262 Difficulty in walking, not elsewhere classified: Secondary | ICD-10-CM

## 2020-05-30 DIAGNOSIS — M25672 Stiffness of left ankle, not elsewhere classified: Secondary | ICD-10-CM

## 2020-05-30 NOTE — Therapy (Addendum)
[place], [place], [address] Phone: [phone]   Fax:  [phone]  Physical Therapy Treatment  Patient Details  Name: Vincent Heath MRN: [id number] Date of Birth: [birth year] Referring Provider (PT): Ophelia Charter, MD   Encounter Date: 05/30/2020   PT End of Session - 05/30/20 1717    Visit Number 11    Number of Visits 19    Date for PT Re-Evaluation 06/20/20    Authorization Type [employer] MCD-10 visits    Authorization Time Period 04/02/20-06/02/19    Authorization - Visit Number 10    Authorization - Number of Visits 10    PT Start Time 0510    PT Stop Time 0550    PT Time Calculation (min) 40 min           Past Medical History:  Diagnosis Date  . 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 ABDOMINAL AORTAGRAM N/A 06/14/2014   Procedure: ABDOMINAL Maxcine Ham;  Surgeon: Conrad Jasper, MD;  Location: [facility] CATH LAB;  Service: Cardiovascular;  Laterality: N/A;  . cheek surgery    . TIBIA IM NAIL INSERTION Left 02/01/2020   Procedure: INTRAMEDULLARY (IM) NAIL TIBIAL;  Surgeon: Hiram Gash, MD;  Location: [facility];  Service: Orthopedics;  Laterality: Left;    There were no vitals filed for this visit.   Subjective Assessment - 05/30/20 1713    Subjective A little bit of pain with standing.    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 No/denies    Aggravating Factors  prolonged standing and walking    Pain Relieving Factors rest              [facility] PT Assessment - 05/30/20 0001      AROM   Left Ankle Dorsiflexion 8    Left Ankle Plantar Flexion 60      Strength   Left Ankle Dorsiflexion 4+/5    Left Ankle Plantar Flexion 4+/5   supine   Left Ankle Inversion 4+/5    Left Ankle Eversion 4+/5                        6  minute walk test results    Aerobic Endurance Distance Walked 1192   no AD                        [facility] Adult PT Treatment/Exercise - 05/30/20 0001      Knee/Hip Exercises: Standing   Functional Squat 20 reps    Functional Squat Limitations partial squat at counter      Ankle Exercises: Aerobic   Nustep L4 x 5 min /LE      Ankle Exercises: Standing   SLS 30 without UE , trial on blue oval , 7 sec best    Rocker Board 2 minutes   dynamic balance 1 min ea. lateral and fw/rev   Heel Raises 20 reps    Toe Raise 20 reps      Ankle Exercises: Seated   Other Seated Ankle Exercises towel IV/EV and toe scrunches x 20 ea.   2# weight on towel for inversion                   PT Short Term Goals - 05/09/20 1422  PT SHORT TERM GOAL #1   Title Independent with HEP    Baseline met-recently updated to include closed chain activities    Time 3    Period Weeks    Status Achieved      PT SHORT TERM GOAL #2   Title Increase left ankle DF AROM at least 3-5 deg to improve toe clearance with gait    Baseline 6 dge AROM    Time 3    Period Weeks    Status Achieved      PT SHORT TERM GOAL #3   Title Pt. to ambulate mod I with RW and CAM boot left LE with bilateral step-through gait with neutral left foot position    Baseline steps through , needs cues for neutral position, CAM boot is discontinued -05/07/20    Time 3    Period Weeks    Status Partially Met             PT Long Term Goals - 05/30/20 1852      PT LONG TERM GOAL #1   Title Left ankle strength grossly 5/5 to improve ankle stability for outdoor ambulation over uneven surfaces    Baseline 4+/5    Time 6    Period Weeks    Status On-going      PT LONG TERM GOAL #2   Title Pt. to be able to perform community ambulation mod I with SPC vs. LRAD with left LE status for  boot vs. brace pending progression as allowed per MD at follow up visits    Baseline is weanig from RW, brings to PT, will  continue to wean    Time 6    Period Weeks    Status On-going      PT LONG TERM GOAL #3   Title Pt. to tolerate standing for periods at least 30 min with left ankle pain 2/10 or less to improve standing tolerance to resume assisting his son at work    Baseline no pain with 2mnutes    Time 6    Period Weeks    Status Achieved                 Plan - 05/30/20 1716    Clinical Impression Statement Pt reports he is standing on and off for 6 hours and the ankle hurts a little, 1-2/10 at the most. He reports he can stand for 2-3 hours without pain.LTG# 4 met. His ankle MMT has improved to 4+/5 measured in supine. 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 He has min antalgic gait and needs cues for neutral LLE. Advised patient that he can use the RW for long distances and if he has increased pain, otherwise to wean off the RW. COntinued with balance and weight shifting with good tolearance. He reports pain at most 1-2/10 during treatment.    PT Next Visit Plan try SPC if continued antalgic pattern without AD, closed chain strengthening, balance/proprioceptive challenges, ankle ROM and stretches, manual prn    PT Home Exercise Plan [facility]: [email or web address]          Patient will benefit from skilled therapeutic intervention in order to improve the following deficits and impairments:  Pain,Impaired flexibility,Decreased strength,Decreased activity tolerance,Increased edema,Decreased range of motion,Abnormal gait,Decreased balance,Difficulty walking,Hypomobility  Visit Diagnosis: Pain in left lower leg  Stiffness of left ankle,  not elsewhere classified  Difficulty in walking, not elsewhere classified  Localized edema  Closed fracture of shaft of left tibia with routine healing, unspecified fracture morphology, subsequent encounter     Problem List Patient Active Problem List    Diagnosis Date Noted  . Closed fracture of left tibia 02/01/2020  . Raynaud's phenomenon 11/05/2014  . Toe pain, bilateral 09/04/2014  . DOE (dyspnea on exertion) 09/04/2014  . Chronic venous insufficiency 05/18/2014  . 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 [place], [place], [address] Phone: [phone]   Fax:  [phone]  Name: Tarin Navarez MRN: [id number] Date of Birth: Aug 29, 1960

## 2020-06-04 ENCOUNTER — Encounter: Payer: Self-pay | Admitting: Physical Therapy

## 2020-06-04 ENCOUNTER — Ambulatory Visit: Payer: Medicaid Other | Admitting: Physical Therapy

## 2020-06-04 ENCOUNTER — Telehealth: Payer: Self-pay | Admitting: Physical Therapy

## 2020-06-04 ENCOUNTER — Other Ambulatory Visit: Payer: Self-pay

## 2020-06-04 DIAGNOSIS — M79662 Pain in left lower leg: Secondary | ICD-10-CM

## 2020-06-04 DIAGNOSIS — R262 Difficulty in walking, not elsewhere classified: Secondary | ICD-10-CM

## 2020-06-04 DIAGNOSIS — S82202D Unspecified fracture of shaft of left tibia, subsequent encounter for closed fracture with routine healing: Secondary | ICD-10-CM

## 2020-06-04 DIAGNOSIS — R600 Localized edema: Secondary | ICD-10-CM

## 2020-06-04 DIAGNOSIS — M25672 Stiffness of left ankle, not elsewhere classified: Secondary | ICD-10-CM

## 2020-06-04 NOTE — Telephone Encounter (Signed)
Called Dr. Rich Fuchs office on patient's behalf to request order for [facility]. Left message with Lemmie Evens re: order with request for it to be faxed to clinic.

## 2020-06-04 NOTE — Therapy (Signed)
[place], [place], [address] Phone: [phone]   Fax:  [phone]  Physical Therapy Treatment/Re-evaluation  Patient Details  Name: Vincent Heath MRN: [id number] Date of Birth: 1960-11-01 Referring Provider (PT): Ophelia Charter, MD   Encounter Date: 06/04/2020   PT End of Session - 06/04/20 1322    Visit Number 12    Number of Visits 20    Date for PT Re-Evaluation 07/09/20    Authorization Type [employer]-needs new authorization    PT Start Time 1320    PT Stop Time 1400    PT Time Calculation (min) 40 min    Activity Tolerance Patient tolerated treatment well    Behavior During Therapy [facility] for tasks assessed/performed           Past Medical History:  Diagnosis Date  . 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 ABDOMINAL AORTAGRAM N/A 06/14/2014   Procedure: ABDOMINAL Maxcine Ham;  Surgeon: Conrad Two Rivers, MD;  Location: [facility] CATH LAB;  Service: Cardiovascular;  Laterality: N/A;  . cheek surgery    . TIBIA IM NAIL INSERTION Left 02/01/2020   Procedure: INTRAMEDULLARY (IM) NAIL TIBIAL;  Surgeon: Hiram Gash, MD;  Location: [facility];  Service: Orthopedics;  Laterality: Left;    There were no vitals filed for this visit.   Subjective Assessment - 06/04/20 1323    Subjective Pt. able to walk short distances now without needing walker but still uses intermittently for more prolonged standing/ambulation including at work. He does not own a SPC. No pain pre-tx. but still gets sore with prolonged standing and walking.    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 No/denies              [facility] PT Assessment - 06/04/20 0001      Assessment   Medical Diagnosis s/p IM nail left tibial fracture    Referring Provider (PT) Ophelia Charter, MD     Onset Date/Surgical Date 02/01/20      AROM   Left Ankle Dorsiflexion 8    Left Ankle Plantar Flexion 62    Left Ankle Inversion 20    Left Ankle Eversion 10      Strength   Left Ankle Dorsiflexion 4+/5    Left Ankle Plantar Flexion 4+/5   supine   Left Ankle Inversion 4+/5    Left Ankle Eversion 4+/5                         [facility] Adult PT Treatment/Exercise - 06/04/20 0001      Ambulation/Gait   Ambulation/Gait Yes    Ambulation/Gait Assistance 6: Modified independent (Device/Increase time)    Ambulation Distance (Feet) 145 Feet    Assistive device Straight cane    Gait Pattern Step-through pattern    Gait Comments Gait training with SPC with cues/instruction for step-through gait sequence holding cane in RUE      Knee/Hip Exercises: Standing   Heel Raises Both;20 reps    Heel Raises Limitations on Airex    Lateral Step Up Left;2 sets;10 reps;Hand Hold: 2;Step Height: 6"    Forward Step Up Left;2 sets;10 reps;Hand Hold: 2;Step Height: 8"    Step Down Left;1 set;15 reps;Hand Hold: 1;Step Height: 4"    Step Down Limitations stepping down from  4 in. step with right LE leading    Functional Squat 20 reps    Functional Squat Limitations TRX squat    Rocker Board 2 minutes    Rocker Board Limitations dynamic balance lateral and fw/rev with blue circle board    SLS with Vectors L SLS on Airex pas with 3-way vector touches 2x12 using right hand support on counter      Ankle Exercises: Aerobic   Nustep L5 x 5 min LE only      Ankle Exercises: Stretches   Slant Board Stretch 3 reps;30 seconds      Ankle Exercises: Supine   T-Band 2x10 ea. with blue band                  PT Education - 06/04/20 1401    Education Details gait training, POC    Person(s) Educated Patient    Methods Explanation;Demonstration;Verbal cues    Comprehension Verbalized understanding;Returned demonstration;Need further instruction;Verbal cues required            PT Short  Term Goals - 05/09/20 1422      PT SHORT TERM GOAL #1   Title Independent with HEP    Baseline met-recently updated to include closed chain activities    Time 3    Period Weeks    Status Achieved      PT SHORT TERM GOAL #2   Title Increase left ankle DF AROM at least 3-5 deg to improve toe clearance with gait    Baseline 6 dge AROM    Time 3    Period Weeks    Status Achieved      PT SHORT TERM GOAL #3   Title Pt. to ambulate mod I with RW and CAM boot left LE with bilateral step-through gait with neutral left foot position    Baseline steps through , needs cues for neutral position, CAM boot is discontinued -05/07/20    Time 3    Period Weeks    Status Partially Met             PT Long Term Goals - 06/04/20 1406      PT LONG TERM GOAL #1   Title Left ankle strength grossly 5/5 to improve ankle stability for outdoor ambulation over uneven surfaces    Baseline 4+/5    Time 4    Period Weeks    Status On-going    Target Date 07/09/20      PT LONG TERM GOAL #2   Title Pt. to be able to perform community ambulation mod I with SPC vs. LRAD with left LE status for  boot vs. brace pending progression as allowed per MD at follow up visits    Baseline weaning from RW, gait training 06/04/20 trial [facility]    Time 4    Period Weeks    Status On-going    Target Date 07/09/20      PT LONG TERM GOAL #3   Title Pt. to tolerate standing for periods at least 30 min with left ankle pain 2/10 or less to improve standing tolerance to resume assisting his son at work    Baseline no pain with 63mnutes    Time 4    Period Weeks    Status Achieved                 Plan - 06/04/20 1324    Clinical Impression Statement Pt. is now 4 months post-op s/p IM nail for left tibial  fracture. 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 Trial gait training with SPC with cues for step-through gait sequence-pt. able to return demos with min-mod cues but would benefit from continued practice/training in therapy sessions. He did demon some improvement with tendency to turn left foot out during gait wihle using cane. Pt. improving but contrinues with left ankle weakness, decreased balance and associated gait impairments so plan continue PT to addresss remaining functional limitations to assist return to PLOF.    Personal Factors and Comorbidities Comorbidity 2    Comorbidities venous insufficiency, oral CA, Raynaud's    Examination-Activity Limitations Transfers;Dressing;Bathing;Lift;Squat;Stairs;Locomotion Level;Stand    Examination-Participation Restrictions Occupation;Laundry;Cleaning;Community Activity;Shop;Meal Prep    Stability/Clinical Decision Making Stable/Uncomplicated    Clinical Decision Making Low    PT Frequency 2x / week    PT Duration 4 weeks    PT Treatment/Interventions ADLs/Self Care Home Management;Cryotherapy;Moist Heat;Therapeutic exercise;Patient/family education;Manual techniques;Passive range of motion;Vasopneumatic Device;Functional mobility training;Gait training;Stair training;Therapeutic activities;Neuromuscular re-education;Balance training;Taping    PT Next Visit Plan await MD order for cane and provide to pt. if received, continue gait training with SPC, continue strengthening progression with closed chain activity focus, gastroc stretches, balance/proprioceptive challenges.    PT Home Exercise Plan Access code: [email or web address]    Consulted and Agree with Plan of Care Patient           Patient will benefit from skilled therapeutic intervention in order to improve the following deficits and impairments:  Pain,Impaired flexibility,Decreased strength,Decreased activity tolerance,Increased edema,Decreased range of motion,Abnormal gait,Decreased balance,Difficulty  walking,Hypomobility  Visit Diagnosis: Pain in left lower leg - Plan: PT plan of care cert/re-cert  Stiffness of left ankle, not elsewhere classified - Plan: PT plan of care cert/re-cert  Difficulty in walking, not elsewhere classified - Plan: PT plan of care cert/re-cert  Localized edema - Plan: PT plan of care cert/re-cert  Closed fracture of shaft of left tibia with routine healing, unspecified fracture morphology, subsequent encounter - Plan: PT plan of care cert/re-cert     Problem List Patient Active Problem List   Diagnosis Date Noted  . Closed fracture of left tibia 02/01/2020  . Raynaud's phenomenon 11/05/2014  . Toe pain, bilateral 09/04/2014  . DOE (dyspnea on exertion) 09/04/2014  . Chronic venous insufficiency 05/18/2014  . 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 [place], [place], [address] Phone: [phone]   Fax:  [phone]  Name: Noboru Bidinger MRN: [id number] Date of Birth: [birth year]

## 2020-06-04 NOTE — Therapy (Signed)
[place], [place], [address] Phone: [phone]   Fax:  [phone]  Physical Therapy Treatment/Re-evaluation  Patient Details  Name: Vincent Heath MRN: [id number] Date of Birth: 1960-11-01 Referring Provider (PT): Ophelia Charter, MD   Encounter Date: 06/04/2020   PT End of Session - 06/04/20 1322    Visit Number 12    Number of Visits 20    Date for PT Re-Evaluation 07/09/20    Authorization Type [employer]-needs new authorization    PT Start Time 1320    PT Stop Time 1400    PT Time Calculation (min) 40 min    Activity Tolerance Patient tolerated treatment well    Behavior During Therapy [facility] for tasks assessed/performed           Past Medical History:  Diagnosis Date  . 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 ABDOMINAL AORTAGRAM N/A 06/14/2014   Procedure: ABDOMINAL Maxcine Ham;  Surgeon: Conrad Two Rivers, MD;  Location: [facility] CATH LAB;  Service: Cardiovascular;  Laterality: N/A;  . cheek surgery    . TIBIA IM NAIL INSERTION Left 02/01/2020   Procedure: INTRAMEDULLARY (IM) NAIL TIBIAL;  Surgeon: Hiram Gash, MD;  Location: [facility];  Service: Orthopedics;  Laterality: Left;    There were no vitals filed for this visit.   Subjective Assessment - 06/04/20 1323    Subjective Pt. able to walk short distances now without needing walker but still uses intermittently for more prolonged standing/ambulation including at work. He does not own a SPC. No pain pre-tx. but still gets sore with prolonged standing and walking.    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 No/denies              [facility] PT Assessment - 06/04/20 0001      Assessment   Medical Diagnosis s/p IM nail left tibial fracture    Referring Provider (PT) Ophelia Charter, MD     Onset Date/Surgical Date 02/01/20      AROM   Left Ankle Dorsiflexion 8    Left Ankle Plantar Flexion 62    Left Ankle Inversion 20    Left Ankle Eversion 10      Strength   Left Ankle Dorsiflexion 4+/5    Left Ankle Plantar Flexion 4+/5   supine   Left Ankle Inversion 4+/5    Left Ankle Eversion 4+/5                         [facility] Adult PT Treatment/Exercise - 06/04/20 0001      Ambulation/Gait   Ambulation/Gait Yes    Ambulation/Gait Assistance 6: Modified independent (Device/Increase time)    Ambulation Distance (Feet) 145 Feet    Assistive device Straight cane    Gait Pattern Step-through pattern    Gait Comments Gait training with SPC with cues/instruction for step-through gait sequence holding cane in RUE      Knee/Hip Exercises: Standing   Heel Raises Both;20 reps    Heel Raises Limitations on Airex    Lateral Step Up Left;2 sets;10 reps;Hand Hold: 2;Step Height: 6"    Forward Step Up Left;2 sets;10 reps;Hand Hold: 2;Step Height: 8"    Step Down Left;1 set;15 reps;Hand Hold: 1;Step Height: 4"    Step Down Limitations stepping down from  4 in. step with right LE leading    Functional Squat 20 reps    Functional Squat Limitations TRX squat    Rocker Board 2 minutes    Rocker Board Limitations dynamic balance lateral and fw/rev with blue circle board    SLS with Vectors L SLS on Airex pas with 3-way vector touches 2x12 using right hand support on counter      Ankle Exercises: Aerobic   Nustep L5 x 5 min LE only      Ankle Exercises: Stretches   Slant Board Stretch 3 reps;30 seconds      Ankle Exercises: Supine   T-Band 2x10 ea. with blue band                  PT Education - 06/04/20 1401    Education Details gait training, POC    Person(s) Educated Patient    Methods Explanation;Demonstration;Verbal cues    Comprehension Verbalized understanding;Returned demonstration;Need further instruction;Verbal cues required            PT Short  Term Goals - 05/09/20 1422      PT SHORT TERM GOAL #1   Title Independent with HEP    Baseline met-recently updated to include closed chain activities    Time 3    Period Weeks    Status Achieved      PT SHORT TERM GOAL #2   Title Increase left ankle DF AROM at least 3-5 deg to improve toe clearance with gait    Baseline 6 dge AROM    Time 3    Period Weeks    Status Achieved      PT SHORT TERM GOAL #3   Title Pt. to ambulate mod I with RW and CAM boot left LE with bilateral step-through gait with neutral left foot position    Baseline steps through , needs cues for neutral position, CAM boot is discontinued -05/07/20    Time 3    Period Weeks    Status Partially Met             PT Long Term Goals - 06/04/20 1406      PT LONG TERM GOAL #1   Title Left ankle strength grossly 5/5 to improve ankle stability for outdoor ambulation over uneven surfaces    Baseline 4+/5    Time 4    Period Weeks    Status On-going    Target Date 07/09/20      PT LONG TERM GOAL #2   Title Pt. to be able to perform community ambulation mod I with SPC vs. LRAD with left LE status for  boot vs. brace pending progression as allowed per MD at follow up visits    Baseline weaning from RW, gait training 06/04/20 trial [facility]    Time 4    Period Weeks    Status On-going    Target Date 07/09/20      PT LONG TERM GOAL #3   Title Pt. to tolerate standing for periods at least 30 min with left ankle pain 2/10 or less to improve standing tolerance to resume assisting his son at work    Baseline no pain with 63mnutes    Time 4    Period Weeks    Status Achieved                 Plan - 06/04/20 1324    Clinical Impression Statement Pt. is now 4 months post-op s/p IM nail for left tibial  fracture. 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 Trial gait training with SPC with cues for step-through gait sequence-pt. able to return demos with min-mod cues but would benefit from continued practice/training in therapy sessions. He did demon some improvement with tendency to turn left foot out during gait wihle using cane. Pt. improving but contrinues with left ankle weakness, decreased balance and associated gait impairments so plan continue PT to addresss remaining functional limitations to assist return to PLOF.    Personal Factors and Comorbidities Comorbidity 2    Comorbidities venous insufficiency, oral CA, Raynaud's    Examination-Activity Limitations Transfers;Dressing;Bathing;Lift;Squat;Stairs;Locomotion Level;Stand    Examination-Participation Restrictions Occupation;Laundry;Cleaning;Community Activity;Shop;Meal Prep    Stability/Clinical Decision Making Stable/Uncomplicated    Clinical Decision Making Low    PT Frequency 2x / week    PT Duration 4 weeks    PT Treatment/Interventions ADLs/Self Care Home Management;Cryotherapy;Moist Heat;Therapeutic exercise;Patient/family education;Manual techniques;Passive range of motion;Vasopneumatic Device;Functional mobility training;Gait training;Stair training;Therapeutic activities;Neuromuscular re-education;Balance training;Taping    PT Next Visit Plan await MD order for cane and provide to pt. if received, continue gait training with SPC, continue strengthening progression with closed chain activity focus, gastroc stretches, balance/proprioceptive challenges.    PT Home Exercise Plan Access code: [email or web address]    Consulted and Agree with Plan of Care Patient           Patient will benefit from skilled therapeutic intervention in order to improve the following deficits and impairments:  Pain,Impaired flexibility,Decreased strength,Decreased activity tolerance,Increased edema,Decreased range of motion,Abnormal gait,Decreased balance,Difficulty  walking,Hypomobility  Visit Diagnosis: Pain in left lower leg  Stiffness of left ankle, not elsewhere classified  Difficulty in walking, not elsewhere classified  Localized edema  Closed fracture of shaft of left tibia with routine healing, unspecified fracture morphology, subsequent encounter     Problem List Patient Active Problem List   Diagnosis Date Noted  . Closed fracture of left tibia 02/01/2020  . Raynaud's phenomenon 11/05/2014  . Toe pain, bilateral 09/04/2014  . DOE (dyspnea on exertion) 09/04/2014  . Chronic venous insufficiency 05/18/2014  . 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

## 2020-06-06 ENCOUNTER — Other Ambulatory Visit: Payer: Self-pay

## 2020-06-06 ENCOUNTER — Ambulatory Visit: Payer: Medicaid Other | Admitting: Physical Therapy

## 2020-06-06 ENCOUNTER — Encounter: Payer: Self-pay | Admitting: Physical Therapy

## 2020-06-06 DIAGNOSIS — R262 Difficulty in walking, not elsewhere classified: Secondary | ICD-10-CM

## 2020-06-06 DIAGNOSIS — M79662 Pain in left lower leg: Secondary | ICD-10-CM

## 2020-06-06 DIAGNOSIS — R600 Localized edema: Secondary | ICD-10-CM

## 2020-06-06 DIAGNOSIS — M25672 Stiffness of left ankle, not elsewhere classified: Secondary | ICD-10-CM

## 2020-06-06 DIAGNOSIS — S82202D Unspecified fracture of shaft of left tibia, subsequent encounter for closed fracture with routine healing: Secondary | ICD-10-CM

## 2020-06-06 NOTE — Therapy (Signed)
[place], [place], [address] Phone: [phone]   Fax:  [phone]  Physical Therapy Treatment  Patient Details  Name: Vincent Heath MRN: [id number] Date of Birth: [birth year] Referring Provider (PT): Ophelia Charter, MD   Encounter Date: 06/06/2020   PT End of Session - 06/06/20 1331    Visit Number 13    Number of Visits 20    Date for PT Re-Evaluation 07/09/20    Authorization Type [employer] MCD-authorization for new visits pending    PT Start Time 1327    PT Stop Time 1408    PT Time Calculation (min) 41 min    Activity Tolerance Patient tolerated treatment well    Behavior During Therapy [facility] for tasks assessed/performed           Past Medical History:  Diagnosis Date  . 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 ABDOMINAL AORTAGRAM N/A 06/14/2014   Procedure: ABDOMINAL Maxcine Ham;  Surgeon: Conrad [place], MD;  Location: [facility] CATH LAB;  Service: Cardiovascular;  Laterality: N/A;  . cheek surgery    . TIBIA IM NAIL INSERTION Left 02/01/2020   Procedure: INTRAMEDULLARY (IM) NAIL TIBIAL;  Surgeon: Hiram Gash, MD;  Location: [place];  Service: Orthopedics;  Laterality: Left;    There were no vitals filed for this visit.   Subjective Assessment - 06/06/20 1328    Subjective No new complaints/concerns since last therapy session earlier this week.                             [place] Adult PT Treatment/Exercise - 06/06/20 0001      Ambulation/Gait   Ambulation/Gait Yes    Ambulation/Gait Assistance 6: Modified independent (Device/Increase time)    Ambulation Distance (Feet) 350 Feet    Assistive device Straight cane    Gait Pattern Step-through pattern;Step-to pattern    Gait Comments min-mod cues for step-trhough gait sequence (progression from step-to sequence) with cues for neutral left foot position and right foot step-through      Knee/Hip  Exercises: Aerobic   Nustep L5 x 5 min LE only      Knee/Hip Exercises: Standing   Heel Raises Both;20 reps    Heel Raises Limitations holding 8 lb. DB ea. UE bilat.    Forward Lunges Left;1 set;15 reps    Forward Lunges Limitations front partial lunge at counter    Lateral Step Up Left;2 sets;10 reps;Hand Hold: 2;Step Height: 6"    Forward Step Up Left;2 sets;10 reps;Hand Hold: 2    Forward Step Up Limitations blue side BOSU    Step Down Left;1 set;15 reps;Hand Hold: 1;Step Height: 4"    Step Down Limitations stepping down from 4 in. step with right LE leading    Functional Squat 20 reps    Functional Squat Limitations TRX squat    Rocker Board 2 minutes    Rocker Board Limitations dynamic balance lateral and fw/rev with blue circle board    SLS with Vectors L SLS on Airex pas with 3-way vector touches 2x12 using right hand support on counter    Other Standing Knee Exercises resisted cable walking fw.rev and side stepping bilaterally x 3 reps ea. iwth 7 lbs. CGA      Ankle Exercises: Stretches   Slant Board Stretch 3 reps;30 seconds      Ankle Exercises: Seated  Heel Raises 20 reps;Left   holding 10 lb. KB over left knee   BAPS Sitting;Level 2;15 reps                  PT Education - 06/06/20 1409    Education Details gait training, if noting left LE edema after work can prop leg in supine and do ankle pumps/ankle ROM to help manage swelling    Person(s) Educated Patient    Methods Explanation;Demonstration;Verbal cues    Comprehension Verbalized understanding;Returned demonstration;Verbal cues required;Need further instruction            PT Short Term Goals - 05/09/20 1422      PT SHORT TERM GOAL #1   Title Independent with HEP    Baseline met-recently updated to include closed chain activities    Time 3    Period Weeks    Status Achieved      PT SHORT TERM GOAL #2   Title Increase left ankle DF AROM at least 3-5 deg to improve toe clearance with gait     Baseline 6 dge AROM    Time 3    Period Weeks    Status Achieved      PT SHORT TERM GOAL #3   Title Pt. to ambulate mod I with RW and CAM boot left LE with bilateral step-through gait with neutral left foot position    Baseline steps through , needs cues for neutral position, CAM boot is discontinued -05/07/20    Time 3    Period Weeks    Status Partially Met             PT Long Term Goals - 06/04/20 1406      PT LONG TERM GOAL #1   Title Left ankle strength grossly 5/5 to improve ankle stability for outdoor ambulation over uneven surfaces    Baseline 4+/5    Time 4    Period Weeks    Status On-going    Target Date 07/09/20      PT LONG TERM GOAL #2   Title Pt. to be able to perform community ambulation mod I with SPC vs. LRAD with left LE status for  boot vs. brace pending progression as allowed per MD at follow up visits    Baseline weaning from RW, gait training 06/04/20 trial [facility]    Time 4    Period Weeks    Status On-going    Target Date 07/09/20      PT LONG TERM GOAL #3   Title Pt. to tolerate standing for periods at least 30 min with left ankle pain 2/10 or less to improve standing tolerance to resume assisting his son at work    Baseline no pain with 60mnutes    Time 4    Period Weeks    Status Achieved                 Plan - 06/06/20 1354    Clinical Impression Statement Continued gait training with SPC (no order received yet for cane as of time of visit) with min-mod cues still required for sequencing with step-through gait pattern but no significant LOB noted. Otherwise continued closed chain strengthening progression and balance/propriocpetive challenges all with good tolerance. Cues needed as noted for gait but overall pt. continues to gradually progress with mobility status.    Personal Factors and Comorbidities Comorbidity 2    Comorbidities venous insufficiency, oral CA, Raynaud's    Examination-Activity Limitations  Transfers;Dressing;Bathing;Lift;Squat;Stairs;Locomotion Level;Stand  Examination-Participation Restrictions Occupation;Laundry;Cleaning;Community Activity;Shop;Meal Prep    Stability/Clinical Decision Making Stable/Uncomplicated    Clinical Decision Making Low    Rehab Potential Good    PT Frequency 2x / week    PT Duration 4 weeks    PT Treatment/Interventions ADLs/Self Care Home Management;Cryotherapy;Moist Heat;Therapeutic exercise;Patient/family education;Manual techniques;Passive range of motion;Vasopneumatic Device;Functional mobility training;Gait training;Stair training;Therapeutic activities;Neuromuscular re-education;Balance training;Taping    PT Next Visit Plan await MD order for cane and provide to pt. if received, continue gait training with SPC, continue strengthening progression with closed chain activity focus, gastroc stretches, balance/proprioceptive challenges.    PT Home Exercise Plan Access code: [email or web address]    Consulted and Agree with Plan of Care Patient           Patient will benefit from skilled therapeutic intervention in order to improve the following deficits and impairments:  Pain,Impaired flexibility,Decreased strength,Decreased activity tolerance,Increased edema,Decreased range of motion,Abnormal gait,Decreased balance,Difficulty walking,Hypomobility  Visit Diagnosis: Pain in left lower leg  Stiffness of left ankle, not elsewhere classified  Difficulty in walking, not elsewhere classified  Localized edema  Closed fracture of shaft of left tibia with routine healing, unspecified fracture morphology, subsequent encounter     Problem List Patient Active Problem List   Diagnosis Date Noted  . Closed fracture of left tibia 02/01/2020  . Raynaud's phenomenon 11/05/2014  . Toe pain, bilateral 09/04/2014  . DOE (dyspnea on exertion) 09/04/2014  . Chronic venous insufficiency 05/18/2014  . 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

## 2020-06-11 ENCOUNTER — Ambulatory Visit: Payer: Medicaid Other | Attending: Orthopaedic Surgery | Admitting: Physical Therapy

## 2020-06-11 ENCOUNTER — Other Ambulatory Visit: Payer: Self-pay

## 2020-06-11 DIAGNOSIS — M79662 Pain in left lower leg: Secondary | ICD-10-CM | POA: Insufficient documentation

## 2020-06-11 DIAGNOSIS — S82202D Unspecified fracture of shaft of left tibia, subsequent encounter for closed fracture with routine healing: Secondary | ICD-10-CM | POA: Insufficient documentation

## 2020-06-11 DIAGNOSIS — M25672 Stiffness of left ankle, not elsewhere classified: Secondary | ICD-10-CM | POA: Insufficient documentation

## 2020-06-11 DIAGNOSIS — R600 Localized edema: Secondary | ICD-10-CM | POA: Diagnosis present

## 2020-06-11 DIAGNOSIS — R262 Difficulty in walking, not elsewhere classified: Secondary | ICD-10-CM | POA: Diagnosis present

## 2020-06-11 NOTE — Therapy (Signed)
[place], [place], [address] Phone: [phone]   Fax:  [phone]  Physical Therapy Treatment  Patient Details  Name: Vincent Heath MRN: [id number] Date of Birth: [birth year] Referring Provider (PT): Ophelia Charter, MD   Encounter Date: 06/11/2020   PT End of Session - 06/11/20 1322    Visit Number 14    Number of Visits 20    Date for PT Re-Evaluation 07/09/20    Authorization Type [employer] [facility] MCD-authorization for new visits pending    Authorization Time Period 04/02/20-06/02/19    Authorization - Visit Number 10    Authorization - Number of Visits 10    PT Start Time 3159    PT Stop Time 4585    PT Time Calculation (min) 43 min           Past Medical History:  Diagnosis Date  . 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 ABDOMINAL AORTAGRAM N/A 06/14/2014   Procedure: ABDOMINAL Maxcine Ham;  Surgeon: Conrad [place], MD;  Location: [facility] CATH LAB;  Service: Cardiovascular;  Laterality: N/A;  . cheek surgery    . TIBIA IM NAIL INSERTION Left 02/01/2020   Procedure: INTRAMEDULLARY (IM) NAIL TIBIAL;  Surgeon: Hiram Gash, MD;  Location: [place];  Service: Orthopedics;  Laterality: Left;    There were no vitals filed for this visit.       [facility] PT Assessment - 06/11/20 0001      Ambulation/Gait   Ambulation/Gait Yes    Ambulation/Gait Assistance 6: Modified independent (Device/Increase time)    Ambulation Distance (Feet) 610 Feet    Assistive device Straight cane    Gait Pattern Step-through pattern;Step-to pattern    Gait Comments min-mod cues for step-trhough gait sequence (progression from step-to sequence) with cues for neutral left foot position and right foot step-through                         [facility] Adult PT Treatment/Exercise - 06/11/20 0001      Knee/Hip Exercises: Aerobic   Nustep L5 x 2 min, L2 x 3 min LE only      Knee/Hip  Exercises: Standing   Heel Raises Both;20 reps    Forward Lunges Left;1 set;15 reps    Forward Lunges Limitations front partial lunge at counter    Lateral Step Up Left;2 sets;10 reps;Hand Hold: 2;Step Height: 6"    Forward Step Up Left;2 sets;10 reps;Hand Hold: 2    Step Down Left;1 set;15 reps;Hand Hold: 1;Step Height: 4"    Step Down Limitations stepping down from 4 in. step with right LE leading   heel strike   Rocker Board 2 minutes   wooden board   SLS with Vectors L SLS on Airex pas with 3-way vector touches 2x12 using right hand support on counter      Knee/Hip Exercises: Seated   Sit to Sand 20 reps      Knee/Hip Exercises: Supine   Bridges 20 reps    Straight Leg Raises 20 reps    Other Supine Knee/Hip Exercises S/L hip abduction x 20      Ankle Exercises: Stretches   Gastroc Stretch 60 seconds                    PT Short Term Goals - 05/09/20 1422      PT SHORT TERM GOAL #1  Title Independent with HEP    Baseline met-recently updated to include closed chain activities    Time 3    Period Weeks    Status Achieved      PT SHORT TERM GOAL #2   Title Increase left ankle DF AROM at least 3-5 deg to improve toe clearance with gait    Baseline 6 dge AROM    Time 3    Period Weeks    Status Achieved      PT SHORT TERM GOAL #3   Title Pt. to ambulate mod I with RW and CAM boot left LE with bilateral step-through gait with neutral left foot position    Baseline steps through , needs cues for neutral position, CAM boot is discontinued -05/07/20    Time 3    Period Weeks    Status Partially Met             PT Long Term Goals - 06/04/20 1406      PT LONG TERM GOAL #1   Title Left ankle strength grossly 5/5 to improve ankle stability for outdoor ambulation over uneven surfaces    Baseline 4+/5    Time 4    Period Weeks    Status On-going    Target Date 07/09/20      PT LONG TERM GOAL #2   Title Pt. to be able to perform community ambulation mod I  with SPC vs. LRAD with left LE status for  boot vs. brace pending progression as allowed per MD at follow up visits    Baseline weaning from RW, gait training 06/04/20 trial [facility]    Time 4    Period Weeks    Status On-going    Target Date 07/09/20      PT LONG TERM GOAL #3   Title Pt. to tolerate standing for periods at least 30 min with left ankle pain 2/10 or less to improve standing tolerance to resume assisting his son at work    Baseline no pain with 68mnutes    Time 4    Period Weeks    Status Achieved                 Plan - 06/11/20 1321    Clinical Impression Statement Pt arrives without AD. Pt reports feet were burning after last session but no pain the next day and no pain now. He reports he works starting at 7 am in his [place]urrayand has no pain while being active on his feet most of the time. [facility] Continued gait trainig with SPC with min- mod cues for sequencing. Continued with balance and ROM exercises for left ankle without increased pain.    PT Next Visit Plan await MD order for cane and provide to pt. if received, continue gait training with SPC, continue strengthening progression with closed chain activity focus, gastroc stretches, balance/proprioceptive challenges.    PT Home Exercise Plan [facility]: [email or web address]          Patient will benefit from skilled therapeutic intervention in order to improve the following deficits and impairments:  Pain,Impaired flexibility,Decreased strength,Decreased activity tolerance,Increased edema,Decreased range of motion,Abnormal gait,Decreased balance,Difficulty walking,Hypomobility  Visit Diagnosis: Pain in left lower leg  Stiffness of left ankle, not elsewhere classified  Difficulty in walking, not elsewhere classified  Localized edema  Closed fracture of shaft of left tibia with routine healing, unspecified fracture morphology, subsequent encounter     Problem List Patient  Active  Problem List   Diagnosis Date Noted  . Closed fracture of left tibia 02/01/2020  . Raynaud's phenomenon 11/05/2014  . Toe pain, bilateral 09/04/2014  . DOE (dyspnea on exertion) 09/04/2014  . Chronic venous insufficiency 05/18/2014  . 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 [place], [place], [address] Phone: [phone]   Fax:  [phone]  Name: Vincent Heath MRN: [id number] Date of Birth: 11/18/1960

## 2020-06-13 ENCOUNTER — Encounter: Payer: Self-pay | Admitting: Physical Therapy

## 2020-06-13 ENCOUNTER — Other Ambulatory Visit: Payer: Self-pay

## 2020-06-13 ENCOUNTER — Ambulatory Visit: Payer: Medicaid Other | Admitting: Physical Therapy

## 2020-06-13 DIAGNOSIS — R262 Difficulty in walking, not elsewhere classified: Secondary | ICD-10-CM

## 2020-06-13 DIAGNOSIS — S82202D Unspecified fracture of shaft of left tibia, subsequent encounter for closed fracture with routine healing: Secondary | ICD-10-CM

## 2020-06-13 DIAGNOSIS — M25672 Stiffness of left ankle, not elsewhere classified: Secondary | ICD-10-CM

## 2020-06-13 DIAGNOSIS — M79662 Pain in left lower leg: Secondary | ICD-10-CM

## 2020-06-13 DIAGNOSIS — R600 Localized edema: Secondary | ICD-10-CM

## 2020-06-13 NOTE — Therapy (Signed)
[employer] [place], [place], [address] Phone: [phone]   Fax:  [phone]  Physical Therapy Treatment  Patient Details  Name: Vincent Heath MRN: [id number] Date of Birth: [birth year] Referring Provider (PT): Ophelia Charter, MD   Encounter Date: 06/13/2020   PT End of Session - 06/13/20 1319    Visit Number 15    Number of Visits 20    Date for PT Re-Evaluation 07/09/20    Authorization Type [employer] MCD    Authorization Time Period 06/06/20-07/19/20    Authorization - Visit Number 3    Authorization - Number of Visits 8    PT Start Time 9983    PT Stop Time 1401    PT Time Calculation (min) 43 min    Activity Tolerance Patient tolerated treatment well    Behavior During Therapy [facility] for tasks assessed/performed           Past Medical History:  Diagnosis Date  . 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 ABDOMINAL AORTAGRAM N/A 06/14/2014   Procedure: ABDOMINAL Maxcine Ham;  Surgeon: Conrad [place], MD;  Location: [facility] CATH LAB;  Service: Cardiovascular;  Laterality: N/A;  . cheek surgery    . TIBIA IM NAIL INSERTION Left 02/01/2020   Procedure: INTRAMEDULLARY (IM) NAIL TIBIAL;  Surgeon: Hiram Gash, MD;  Location: [place];  Service: Orthopedics;  Laterality: Left;    There were no vitals filed for this visit.   Subjective Assessment - 06/13/20 1318    Subjective No LLE pain pre-tx. Received order from Dr. Rich Fuchs office for [facility] for pt. so provided to pt. and discussed potential local DME providers to obtain.    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 No/denies                             [facility] Adult PT Treatment/Exercise - 06/13/20 0001      Ambulation/Gait    Ambulation/Gait Yes    Ambulation/Gait Assistance 6: Modified independent (Device/Increase time)    Ambulation Distance (Feet) 540 Feet    Assistive device Straight cane    Gait Pattern Step-through pattern    Gait Comments min-mod cues for sequencing for step-through gait pattern holding cane in RUE      Knee/Hip Exercises: Standing   Heel Raises --   see under ankle exercises   Forward Lunges Left;2 sets;10 reps    Forward Lunges Limitations front partial lunge with right hand support on counter    Lateral Step Up Left;2 sets;10 reps;Hand Hold: 2    Lateral Step Up Limitations to blue side BOSU    Forward Step Up Left;2 sets;10 reps;Hand Hold: 2;Step Height: 8"    Step Down Left;2 sets;10 reps;Hand Hold: 1;Step Height: 4"    Step Down Limitations stepping down with RLE    Functional Squat Limitations "touch and go" squat/sit<>stand holding 10 lb. KB with touch ot Airex pad on bari chair    Rocker Board 2 minutes   wooden board   SLS L SLS on Airex 4 x 15 sec intermittent UE support on counter    SLS with Vectors L SLS on blue Theraband pad 3-way vector reaches 2x15    Other Standing Knee Exercises left side  split squat/partial lunge x 20 reps      Ankle Exercises: Aerobic   Nustep L5 x 5 min LE only      Ankle Exercises: Standing   Heel Raises --   3x10 holding 6 lb. DB ea. UE bilat.   Other Standing Ankle Exercises Tandem stance with left foot back x 60 sec with SBA. [place]berg on Airex eyes closed 20 sec x 3 with SBA      Ankle Exercises: Stretches   Slant Board Stretch 3 reps;30 seconds                    PT Short Term Goals - 05/09/20 1422      PT SHORT TERM GOAL #1   Title Independent with HEP    Baseline met-recently updated to include closed chain activities    Time 3    Period Weeks    Status Achieved      PT SHORT TERM GOAL #2   Title Increase left ankle DF AROM at least 3-5 deg to improve toe clearance with gait    Baseline 6 dge AROM    Time 3     Period Weeks    Status Achieved      PT SHORT TERM GOAL #3   Title Pt. to ambulate mod I with RW and CAM boot left LE with bilateral step-through gait with neutral left foot position    Baseline steps through , needs cues for neutral position, CAM boot is discontinued -05/07/20    Time 3    Period Weeks    Status Partially Met             PT Long Term Goals - 06/04/20 1406      PT LONG TERM GOAL #1   Title Left ankle strength grossly 5/5 to improve ankle stability for outdoor ambulation over uneven surfaces    Baseline 4+/5    Time 4    Period Weeks    Status On-going    Target Date 07/09/20      PT LONG TERM GOAL #2   Title Pt. to be able to perform community ambulation mod I with SPC vs. LRAD with left LE status for  boot vs. brace pending progression as allowed per MD at follow up visits    Baseline weaning from RW, gait training 06/04/20 trial [facility]    Time 4    Period Weeks    Status On-going    Target Date 07/09/20      PT LONG TERM GOAL #3   Title Pt. to tolerate standing for periods at least 30 min with left ankle pain 2/10 or less to improve standing tolerance to resume assisting his son at work    Baseline no pain with 81mnutes    Time 4    Period Weeks    Status Achieved                 Plan - 06/13/20 1334    Clinical Impression Statement Continued gait training with SPC-still requires min-mod cues for sequencing to maintain step-through pattern but improving return of demos with less cueing required than previous recent visits. Otherwise continued balance/proprioceptive challnges and closed chain strengthening progression with good tolerance/no pain noted and progressing well with strength gains from previous status.    Personal Factors and Comorbidities Comorbidity 2    Comorbidities venous insufficiency, oral CA, Raynaud's    Examination-Activity Limitations Transfers;Dressing;Bathing;Lift;Squat;Stairs;Locomotion Level;Stand     Examination-Participation Restrictions Occupation;Laundry;Cleaning;Community  Activity;Shop;Meal Prep    Stability/Clinical Decision Making Stable/Uncomplicated    Clinical Decision Making Low    PT Frequency 2x / week    PT Duration 4 weeks    PT Treatment/Interventions ADLs/Self Care Home Management;Cryotherapy;Moist Heat;Therapeutic exercise;Patient/family education;Manual techniques;Passive range of motion;Vasopneumatic Device;Functional mobility training;Gait training;Stair training;Therapeutic activities;Neuromuscular re-education;Balance training;Taping    PT Next Visit Plan Continue gait training with SPC pending need for cueing, continue strengthening and balance progression    PT Home Exercise Plan Access code: [email or web address]    Consulted and Agree with Plan of Care Patient           Patient will benefit from skilled therapeutic intervention in order to improve the following deficits and impairments:  Pain,Impaired flexibility,Decreased strength,Decreased activity tolerance,Increased edema,Decreased range of motion,Abnormal gait,Decreased balance,Difficulty walking,Hypomobility  Visit Diagnosis: Pain in left lower leg  Stiffness of left ankle, not elsewhere classified  Difficulty in walking, not elsewhere classified  Localized edema  Closed fracture of shaft of left tibia with routine healing, unspecified fracture morphology, subsequent encounter     Problem List Patient Active Problem List   Diagnosis Date Noted  . Closed fracture of left tibia 02/01/2020  . Raynaud's phenomenon 11/05/2014  . Toe pain, bilateral 09/04/2014  . DOE (dyspnea on exertion) 09/04/2014  . Chronic venous insufficiency 05/18/2014  . 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 [place], [place], [address] Phone: [phone]   Fax:  [phone]  Name: Vincent Heath MRN: [id number] Date of Birth: 07/17/1960

## 2020-06-18 ENCOUNTER — Ambulatory Visit: Payer: Medicaid Other | Admitting: Physical Therapy

## 2020-06-18 ENCOUNTER — Encounter: Payer: Self-pay | Admitting: Physical Therapy

## 2020-06-18 ENCOUNTER — Other Ambulatory Visit: Payer: Self-pay

## 2020-06-18 DIAGNOSIS — S82202D Unspecified fracture of shaft of left tibia, subsequent encounter for closed fracture with routine healing: Secondary | ICD-10-CM

## 2020-06-18 DIAGNOSIS — R600 Localized edema: Secondary | ICD-10-CM

## 2020-06-18 DIAGNOSIS — M79662 Pain in left lower leg: Secondary | ICD-10-CM | POA: Diagnosis not present

## 2020-06-18 DIAGNOSIS — R262 Difficulty in walking, not elsewhere classified: Secondary | ICD-10-CM

## 2020-06-18 DIAGNOSIS — M25672 Stiffness of left ankle, not elsewhere classified: Secondary | ICD-10-CM

## 2020-06-18 NOTE — Therapy (Signed)
[place], [place], [address] Phone: [phone]   Fax:  [phone]  Physical Therapy Treatment  Patient Details  Name: Vincent Heath MRN: [id number] Date of Birth: 03/15/1961 Referring Provider (PT): Ophelia Charter, MD   Encounter Date: 06/18/2020   PT End of Session - 06/18/20 1324    Visit Number 16    Number of Visits 20    Date for PT Re-Evaluation 07/09/20    Authorization Type [employer]    Authorization Time Period 06/06/20-07/19/20    Authorization - Visit Number 4    Authorization - Number of Visits 8    PT Start Time 8280    PT Stop Time 1406    PT Time Calculation (min) 40 min    Activity Tolerance Patient tolerated treatment well    Behavior During Therapy [facility] for tasks assessed/performed           Past Medical History:  Diagnosis Date  . 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 ABDOMINAL AORTAGRAM N/A 06/14/2014   Procedure: ABDOMINAL Maxcine Ham;  Surgeon: Conrad [place], MD;  Location: [facility] CATH LAB;  Service: Cardiovascular;  Laterality: N/A;  . cheek surgery    . TIBIA IM NAIL INSERTION Left 02/01/2020   Procedure: INTRAMEDULLARY (IM) NAIL TIBIAL;  Surgeon: Hiram Gash, MD;  Location: [place];  Service: Orthopedics;  Laterality: Left;    There were no vitals filed for this visit.   Subjective Assessment - 06/18/20 1326    Subjective Pt. has not yet been able to take MD order for [facility] to DME provided yet. No pain pre-tx.    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 No/denies                             [facility] Adult PT Treatment/Exercise - 06/18/20 0001      Knee/Hip Exercises: Machines for Strengthening   Cybex Leg Press Left  unilat. leg press 15 lbs. 2x10      Knee/Hip Exercises: Standing   Forward Lunges Left;2 sets;10 reps    Forward Lunges Limitations front partial lunge with right hand support on counter    Lateral Step Up Left;2 sets;10 reps;Hand Hold: 2    Lateral Step Up Limitations to blue side BOSU    Forward Step Up Left;2 sets;10 reps;Hand Hold: 2;Step Height: 8"    Step Down Left;2 sets;10 reps;Hand Hold: 1;Step Height: 4"    Step Down Limitations stepping down with RLE    Functional Squat Limitations "touch and go" squat/sit<>stand holding 10 lb. KB with touch ot Airex pad on bari chair   2x10   Rocker Board 2 minutes    Rocker Board Limitations 1 min fw/rev on wooden board, 1 min lateral shifts on blue circle board    SLS L SLS on Airex x 1 min intermittent UE support on counter    SLS with Vectors L SLS on blue Theraband pad 3-way vector reaches 2x15      Ankle Exercises: Aerobic   Nustep L5 x 5 min LE only      Ankle Exercises: Standing   Heel Raises --   3x10 holding 8 lb. DB bilat.  Other Standing Ankle Exercises tandem stance x 1 min ea. bilat., Romberg eyes closed feet 3-4 in. apart on Airex x 1 min, standing BAPS L1 circles CW/CCW x 15 ea. way, weightshifts on black side BOSU x 20 ea. way with CGA and bilat. UE support on counter                    PT Short Term Goals - 05/09/20 1422      PT SHORT TERM GOAL #1   Title Independent with HEP    Baseline met-recently updated to include closed chain activities    Time 3    Period Weeks    Status Achieved      PT SHORT TERM GOAL #2   Title Increase left ankle DF AROM at least 3-5 deg to improve toe clearance with gait    Baseline 6 dge AROM    Time 3    Period Weeks    Status Achieved      PT SHORT TERM GOAL #3   Title Pt. to ambulate mod I with RW and CAM boot left LE with bilateral step-through gait with neutral left foot position    Baseline steps through , needs cues for neutral position, CAM boot is discontinued  -05/07/20    Time 3    Period Weeks    Status Partially Met             PT Long Term Goals - 06/04/20 1406      PT LONG TERM GOAL #1   Title Left ankle strength grossly 5/5 to improve ankle stability for outdoor ambulation over uneven surfaces    Baseline 4+/5    Time 4    Period Weeks    Status On-going    Target Date 07/09/20      PT LONG TERM GOAL #2   Title Pt. to be able to perform community ambulation mod I with SPC vs. LRAD with left LE status for  boot vs. brace pending progression as allowed per MD at follow up visits    Baseline weaning from RW, gait training 06/04/20 trial [facility]    Time 4    Period Weeks    Status On-going    Target Date 07/09/20      PT LONG TERM GOAL #3   Title Pt. to tolerate standing for periods at least 30 min with left ankle pain 2/10 or less to improve standing tolerance to resume assisting his son at work    Baseline no pain with 71mnutes    Time 4    Period Weeks    Status Achieved                 Plan - 06/18/20 1411    Clinical Impression Statement Pt. continues to progress with his ability for dynamic balance/proprioceptive challenges and ability for closed chain strengthening progress. Held gait training today for more exercise focus but [facility] and step-through gait pattern. Pt. does well with short distances independently without AD though need verbal cues for neutral foot position due to tendency to turn out left foot but may benefit from cane for more prolonged distances and ambulation over uneven surfaces.    Personal Factors and Comorbidities Comorbidity 2    Comorbidities venous insufficiency, oral CA, Raynaud's    Examination-Activity Limitations Transfers;Dressing;Bathing;Lift;Squat;Stairs;Locomotion Level;Stand    Examination-Participation Restrictions Occupation;Laundry;Cleaning;Community Activity;Shop;Meal Prep    Stability/Clinical Decision Making Stable/Uncomplicated  Clinical Decision Making Low    Rehab Potential Good    PT Frequency 2x / week    PT Duration 4 weeks    PT Treatment/Interventions ADLs/Self Care Home Management;Cryotherapy;Moist Heat;Therapeutic exercise;Patient/family education;Manual techniques;Passive range of motion;Vasopneumatic Device;Functional mobility training;Gait training;Stair training;Therapeutic activities;Neuromuscular re-education;Balance training;Taping    PT Next Visit Plan Was pt. able to obtain SPC-gait training prn, otherwise continue progression of strengthening and balance/proprioceptive challenges, consider more dynamic gait challenges with head turns, change in gait speed    PT Home Exercise Plan Access code: [email or web address]    Consulted and Agree with Plan of Care Patient           Patient will benefit from skilled therapeutic intervention in order to improve the following deficits and impairments:  Pain,Impaired flexibility,Decreased strength,Decreased activity tolerance,Increased edema,Decreased range of motion,Abnormal gait,Decreased balance,Difficulty walking,Hypomobility  Visit Diagnosis: Pain in left lower leg  Stiffness of left ankle, not elsewhere classified  Difficulty in walking, not elsewhere classified  Localized edema  Closed fracture of shaft of left tibia with routine healing, unspecified fracture morphology, subsequent encounter     Problem List Patient Active Problem List   Diagnosis Date Noted  . Closed fracture of left tibia 02/01/2020  . Raynaud's phenomenon 11/05/2014  . Toe pain, bilateral 09/04/2014  . DOE (dyspnea on exertion) 09/04/2014  . Chronic venous insufficiency 05/18/2014  . 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 [place], [place], [address] Phone: [phone]   Fax:  [phone]  Name: Vincent Heath MRN: [id number] Date of  Birth: 1960/04/23

## 2020-06-20 ENCOUNTER — Encounter: Payer: Self-pay | Admitting: Physical Therapy

## 2020-06-20 ENCOUNTER — Other Ambulatory Visit: Payer: Self-pay

## 2020-06-20 ENCOUNTER — Ambulatory Visit: Payer: Medicaid Other | Admitting: Physical Therapy

## 2020-06-20 DIAGNOSIS — S82202D Unspecified fracture of shaft of left tibia, subsequent encounter for closed fracture with routine healing: Secondary | ICD-10-CM

## 2020-06-20 DIAGNOSIS — M25672 Stiffness of left ankle, not elsewhere classified: Secondary | ICD-10-CM

## 2020-06-20 DIAGNOSIS — R262 Difficulty in walking, not elsewhere classified: Secondary | ICD-10-CM

## 2020-06-20 DIAGNOSIS — R600 Localized edema: Secondary | ICD-10-CM

## 2020-06-20 DIAGNOSIS — M79662 Pain in left lower leg: Secondary | ICD-10-CM

## 2020-06-20 NOTE — Therapy (Signed)
[place], [place], [address] Phone: [phone]   Fax:  [phone]  Physical Therapy Treatment  Patient Details  Name: Vincent Heath MRN: [id number] Date of Birth: [birth year] Referring Provider (PT): Ophelia Charter, MD   Encounter Date: 06/20/2020   PT End of Session - 06/20/20 1337    Visit Number 17    Number of Visits 20    Date for PT Re-Evaluation 07/09/20    Authorization Type [employer]    Authorization Time Period 06/06/20-07/19/20    Authorization - Visit Number 5    Authorization - Number of Visits 8    PT Start Time 0211   arrived a few minutes late   PT Stop Time 1415    PT Time Calculation (min) 39 min    Activity Tolerance Patient tolerated treatment well    Behavior During Therapy [facility] for tasks assessed/performed           Past Medical History:  Diagnosis Date  . 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 ABDOMINAL AORTAGRAM N/A 06/14/2014   Procedure: ABDOMINAL Maxcine Ham;  Surgeon: Conrad [place], MD;  Location: [facility] CATH LAB;  Service: Cardiovascular;  Laterality: N/A;  . cheek surgery    . TIBIA IM NAIL INSERTION Left 02/01/2020   Procedure: INTRAMEDULLARY (IM) NAIL TIBIAL;  Surgeon: Hiram Gash, MD;  Location: [place];  Service: Orthopedics;  Laterality: Left;    There were no vitals filed for this visit.   Subjective Assessment - 06/20/20 1337    Subjective Pt. reports tried to go to 2 different medical supply stores but they did not take his insurance for [facility].    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 No/denies                             [facility] Adult PT Treatment/Exercise - 06/20/20 0001      Ambulation/Gait   Ambulation/Gait Assistance --   no AD today   Ambulation Distance (Feet) 185  Feet    Gait Comments brief work on independent ambulation without AD cues for neutral left foot position      Knee/Hip Exercises: Machines for Strengthening   Cybex Leg Press Left unilat. leg press 15 lbs. 2x10      Knee/Hip Exercises: Standing   Forward Lunges Left;2 sets;10 reps    Forward Lunges Limitations multi-planar partial lunges front, side, rear x 20 reps total    Lateral Step Up Left;2 sets;10 reps;Hand Hold: 2    Lateral Step Up Limitations to blue side BOSU    Forward Step Up Left;2 sets;10 reps;Hand Hold: 0    Forward Step Up Limitations to 6 in. step with Airex on top    Step Down Left;2 sets;10 reps;Hand Hold: 1;Step Height: 6"    Step Down Limitations stepping down with RLE    Functional Squat Limitations "touch and go" squat/sit<>stand holding 10 lb. KB with touch ot Airex pad on bari chair   2x10   Rocker Board 2 minutes    Rocker Board Limitations 1 min fw/rev on wooden board, 1 min lateral shifts on blue circle board    SLS SLS on Airex x 1 min intermittent UE support on counter, performed on both sides  Ankle Exercises: Aerobic   Nustep L5 x 5 min LE only      Ankle Exercises: Standing   Heel Raises --   3x10 holding 8 lb. DB bilat.   Other Standing Ankle Exercises blackside BOSU weightshifts at counter x 20 reps CGA    Other Standing Ankle Exercises standing BAPS L1 x 20 ea. direction, tandem stance on Airex x 1 min ea. bilat., Pall off press with feet together on Airex blue band x 20 ea. way bilat.      Ankle Exercises: Stretches   Slant Board Stretch 2 reps;30 seconds                    PT Short Term Goals - 05/09/20 1422      PT SHORT TERM GOAL #1   Title Independent with HEP    Baseline met-recently updated to include closed chain activities    Time 3    Period Weeks    Status Achieved      PT SHORT TERM GOAL #2   Title Increase left ankle DF AROM at least 3-5 deg to improve toe clearance with gait    Baseline 6 dge AROM    Time 3     Period Weeks    Status Achieved      PT SHORT TERM GOAL #3   Title Pt. to ambulate mod I with RW and CAM boot left LE with bilateral step-through gait with neutral left foot position    Baseline steps through , needs cues for neutral position, CAM boot is discontinued -05/07/20    Time 3    Period Weeks    Status Partially Met             PT Long Term Goals - 06/04/20 1406      PT LONG TERM GOAL #1   Title Left ankle strength grossly 5/5 to improve ankle stability for outdoor ambulation over uneven surfaces    Baseline 4+/5    Time 4    Period Weeks    Status On-going    Target Date 07/09/20      PT LONG TERM GOAL #2   Title Pt. to be able to perform community ambulation mod I with SPC vs. LRAD with left LE status for  boot vs. brace pending progression as allowed per MD at follow up visits    Baseline weaning from RW, gait training 06/04/20 trial [facility]    Time 4    Period Weeks    Status On-going    Target Date 07/09/20      PT LONG TERM GOAL #3   Title Pt. to tolerate standing for periods at least 30 min with left ankle pain 2/10 or less to improve standing tolerance to resume assisting his son at work    Baseline no pain with 43mnutes    Time 4    Period Weeks    Status Achieved                 Plan - 06/20/20 1353    Clinical Impression Statement Discussed other potential DME provider to check with regarding SPC vs. self-pay options. As previously still some tendency to turn out left foot with ambulation but otherwise progressing appropriately with LLE strengthening, balance and gait s/p surgery.    Personal Factors and Comorbidities Comorbidity 2    Comorbidities venous insufficiency, oral CA, Raynaud's    Examination-Activity Limitations Transfers;Dressing;Bathing;Lift;Squat;Stairs;Locomotion Level;Stand    Examination-Participation Restrictions Occupation;Laundry;Cleaning;Community Activity;Shop;Meal Prep  Stability/Clinical Decision Making  Stable/Uncomplicated    Clinical Decision Making Low    Rehab Potential Good    PT Frequency 2x / week    PT Duration 4 weeks    PT Treatment/Interventions ADLs/Self Care Home Management;Cryotherapy;Moist Heat;Therapeutic exercise;Patient/family education;Manual techniques;Passive range of motion;Vasopneumatic Device;Functional mobility training;Gait training;Stair training;Therapeutic activities;Neuromuscular re-education;Balance training;Taping    PT Next Visit Plan Was pt. able to obtain SPC-gait training prn, otherwise continue progression of strengthening and balance/proprioceptive challenges, consider more dynamic gait challenges with head turns, change in gait speed    PT Home Exercise Plan Access code: [email or web address]    Consulted and Agree with Plan of Care Patient           Patient will benefit from skilled therapeutic intervention in order to improve the following deficits and impairments:  Pain,Impaired flexibility,Decreased strength,Decreased activity tolerance,Increased edema,Decreased range of motion,Abnormal gait,Decreased balance,Difficulty walking,Hypomobility  Visit Diagnosis: Pain in left lower leg  Stiffness of left ankle, not elsewhere classified  Difficulty in walking, not elsewhere classified  Localized edema  Closed fracture of shaft of left tibia with routine healing, unspecified fracture morphology, subsequent encounter     Problem List Patient Active Problem List   Diagnosis Date Noted  . Closed fracture of left tibia 02/01/2020  . Raynaud's phenomenon 11/05/2014  . Toe pain, bilateral 09/04/2014  . DOE (dyspnea on exertion) 09/04/2014  . Chronic venous insufficiency 05/18/2014  . 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

## 2020-06-27 ENCOUNTER — Ambulatory Visit: Payer: Medicaid Other | Admitting: Physical Therapy

## 2020-06-27 ENCOUNTER — Telehealth: Payer: Self-pay | Admitting: Physical Therapy

## 2020-06-27 NOTE — Telephone Encounter (Signed)
Called regarding no show for therapy appointment today. He missed appointment due to ride/transportation issues. Confirmed next appointment for 07/09/20.

## 2020-07-09 ENCOUNTER — Other Ambulatory Visit: Payer: Self-pay

## 2020-07-09 ENCOUNTER — Encounter: Payer: Self-pay | Admitting: Physical Therapy

## 2020-07-09 ENCOUNTER — Ambulatory Visit: Payer: Medicaid Other | Admitting: Physical Therapy

## 2020-07-09 DIAGNOSIS — M25672 Stiffness of left ankle, not elsewhere classified: Secondary | ICD-10-CM

## 2020-07-09 DIAGNOSIS — M79662 Pain in left lower leg: Secondary | ICD-10-CM

## 2020-07-09 DIAGNOSIS — S82202D Unspecified fracture of shaft of left tibia, subsequent encounter for closed fracture with routine healing: Secondary | ICD-10-CM

## 2020-07-09 DIAGNOSIS — R600 Localized edema: Secondary | ICD-10-CM

## 2020-07-09 DIAGNOSIS — R262 Difficulty in walking, not elsewhere classified: Secondary | ICD-10-CM

## 2020-07-09 NOTE — Therapy (Signed)
[place], [place], [address] Phone: [phone]   Fax:  [phone]  Physical Therapy Treatment  Patient Details  Name: Vincent Heath MRN: [id number] Date of Birth: May 24, 1960 Referring Provider (PT): Ophelia Charter, MD   Encounter Date: 07/09/2020   PT End of Session - 07/09/20 1332    Visit Number 18    Number of Visits 20    Date for PT Re-Evaluation 07/09/20    Authorization Type [employer]    Authorization Time Period 06/06/20-07/19/20    Authorization - Visit Number 6    Authorization - Number of Visits 8    PT Start Time 1331    PT Stop Time 1410    PT Time Calculation (min) 39 min    Activity Tolerance Patient tolerated treatment well    Behavior During Therapy [facility] for tasks assessed/performed           Past Medical History:  Diagnosis Date  . 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 ABDOMINAL AORTAGRAM N/A 06/14/2014   Procedure: ABDOMINAL Maxcine Ham;  Surgeon: Conrad [place], MD;  Location: [facility] CATH LAB;  Service: Cardiovascular;  Laterality: N/A;  . cheek surgery    . TIBIA IM NAIL INSERTION Left 02/01/2020   Procedure: INTRAMEDULLARY (IM) NAIL TIBIAL;  Surgeon: Hiram Gash, MD;  Location: [place];  Service: Orthopedics;  Laterality: Left;    There were no vitals filed for this visit.   Subjective Assessment - 07/09/20 1333    Subjective Pt. returns, not seen since 06/20/20 (missed last visit due to transportation issues). He arrives with [facility] which he reports was able to get about 10 days ago. He reports mild medial ankle pain 1/10 which he attributes to his footwear today.    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 No/denies              [facility] PT Assessment - 07/09/20 0001      AROM   Right Ankle Dorsiflexion 7     Left Ankle Dorsiflexion 6   10 deg after talocrural mobilization                        [facility] Adult PT Treatment/Exercise - 07/09/20 0001      Knee/Hip Exercises: Machines for Strengthening   Cybex Leg Press Left unilat. leg press 15 lbs. 2x10      Knee/Hip Exercises: Standing   Forward Lunges Left;2 sets;10 reps    Lateral Step Up Left;2 sets;10 reps;Hand Hold: 2    Lateral Step Up Limitations to blue side BOSU    Forward Step Up Left;2 sets;10 reps;Hand Hold: 0;Step Height: 8"    Functional Squat Limitations squat at counter 2x10    Rocker Board 2 minutes    Rocker Board Limitations 1 min fw/rev on wooden board, 1 min lateral shifts on blue circle board    SLS L SLS on blue Theraband pad 10 sec x 5 reps    SLS with Vectors L SLS on blue Theraband pad with 3 way vector "taps" x 30 reps    Walking with Sports Cord walking with [employer] cable resistance 73fet back and forth fw/rev and sidestepping bilat. x 3 ea. with 13 lbs.      Manual Therapy   Joint Mobilization left  talocural mobilization AP grade III-IV      Ankle Exercises: Aerobic   Nustep L5 x 5 min LE only      Ankle Exercises: Stretches   Gastroc Stretch 3 reps;30 seconds   supine manual stretch left side after joint mobs     Ankle Exercises: Standing   Heel Raises --   3x10 holding 10 lb. DB ea. UE bilat.   Other Standing Ankle Exercises pall off press in oartial tandem blue band x 15 ea. way bilat.                    PT Short Term Goals - 05/09/20 1422      PT SHORT TERM GOAL #1   Title Independent with HEP    Baseline met-recently updated to include closed chain activities    Time 3    Period Weeks    Status Achieved      PT SHORT TERM GOAL #2   Title Increase left ankle DF AROM at least 3-5 deg to improve toe clearance with gait    Baseline 6 dge AROM    Time 3    Period Weeks    Status Achieved      PT SHORT TERM GOAL #3   Title Pt. to ambulate mod I with RW and CAM boot  left LE with bilateral step-through gait with neutral left foot position    Baseline steps through , needs cues for neutral position, CAM boot is discontinued -05/07/20    Time 3    Period Weeks    Status Partially Met             PT Long Term Goals - 06/04/20 1406      PT LONG TERM GOAL #1   Title Left ankle strength grossly 5/5 to improve ankle stability for outdoor ambulation over uneven surfaces    Baseline 4+/5    Time 4    Period Weeks    Status On-going    Target Date 07/09/20      PT LONG TERM GOAL #2   Title Pt. to be able to perform community ambulation mod I with SPC vs. LRAD with left LE status for  boot vs. brace pending progression as allowed per MD at follow up visits    Baseline weaning from RW, gait training 06/04/20 trial [facility]    Time 4    Period Weeks    Status On-going    Target Date 07/09/20      PT LONG TERM GOAL #3   Title Pt. to tolerate standing for periods at least 30 min with left ankle pain 2/10 or less to improve standing tolerance to resume assisting his son at work    Baseline no pain with 68mnutes    Time 4    Period Weeks    Status Achieved                 Plan - 07/09/20 1402    Clinical Impression Statement Pt. still demonstrating tendency to toe out on left side. Checked ankle DF with slight stiffness vs. contralateral side shich suspected could be contributing factor-able to improve DF AROM 4 deg after joint mobs but still tendency to toe out so suspect could be more due to habit. Otherwise/overall progressing well and plan tentative d/c to HEP in the next 1-2 visits.    Personal Factors and Comorbidities Comorbidity 2    Comorbidities venous insufficiency, oral CA, Raynaud's    Examination-Activity  Limitations Transfers;Dressing;Bathing;Lift;Squat;Stairs;Locomotion Level;Stand    Examination-Participation Restrictions Occupation;Laundry;Cleaning;Community Activity;Shop;Meal Prep    Stability/Clinical Decision Making  Stable/Uncomplicated    Clinical Decision Making Low    Rehab Potential Good    PT Frequency 2x / week    PT Duration 4 weeks    PT Treatment/Interventions ADLs/Self Care Home Management;Cryotherapy;Moist Heat;Therapeutic exercise;Patient/family education;Manual techniques;Passive range of motion;Vasopneumatic Device;Functional mobility training;Gait training;Stair training;Therapeutic activities;Neuromuscular re-education;Balance training;Taping    PT Next Visit Plan tentative d/c next 1-2 visits, update HEP as needed, focus ankle stabilization, balkance/proprioception, LE strengthening as tolerated    PT Home Exercise Plan Access code: [email or web address]    Consulted and Agree with Plan of Care Patient           Patient will benefit from skilled therapeutic intervention in order to improve the following deficits and impairments:  Pain,Impaired flexibility,Decreased strength,Decreased activity tolerance,Increased edema,Decreased range of motion,Abnormal gait,Decreased balance,Difficulty walking,Hypomobility  Visit Diagnosis: Pain in left lower leg  Stiffness of left ankle, not elsewhere classified  Difficulty in walking, not elsewhere classified  Localized edema  Closed fracture of shaft of left tibia with routine healing, unspecified fracture morphology, subsequent encounter     Problem List Patient Active Problem List   Diagnosis Date Noted  . Closed fracture of left tibia 02/01/2020  . Raynaud's phenomenon 11/05/2014  . Toe pain, bilateral 09/04/2014  . DOE (dyspnea on exertion) 09/04/2014  . Chronic venous insufficiency 05/18/2014  . 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

## 2020-07-16 ENCOUNTER — Ambulatory Visit: Payer: Medicaid Other | Admitting: Physical Therapy

## 2020-08-01 ENCOUNTER — Encounter: Payer: Self-pay | Admitting: Physical Therapy

## 2020-08-01 ENCOUNTER — Ambulatory Visit: Payer: Medicaid Other | Attending: Orthopaedic Surgery | Admitting: Physical Therapy

## 2020-08-01 ENCOUNTER — Other Ambulatory Visit: Payer: Self-pay

## 2020-08-01 DIAGNOSIS — M79662 Pain in left lower leg: Secondary | ICD-10-CM | POA: Diagnosis present

## 2020-08-01 DIAGNOSIS — M25672 Stiffness of left ankle, not elsewhere classified: Secondary | ICD-10-CM | POA: Insufficient documentation

## 2020-08-01 DIAGNOSIS — R600 Localized edema: Secondary | ICD-10-CM | POA: Diagnosis present

## 2020-08-01 DIAGNOSIS — S82202D Unspecified fracture of shaft of left tibia, subsequent encounter for closed fracture with routine healing: Secondary | ICD-10-CM | POA: Insufficient documentation

## 2020-08-01 DIAGNOSIS — R262 Difficulty in walking, not elsewhere classified: Secondary | ICD-10-CM | POA: Diagnosis present

## 2020-08-01 NOTE — Therapy (Signed)
[place], [place], [address] Phone: [phone]   Fax:  [phone]  Physical Therapy Treatment  Patient Details  Name: Vincent Heath MRN: [id number] Date of Birth: [birth year] Referring Provider (PT): Ophelia Charter, MD   Encounter Date: 08/01/2020   PT End of Session - 08/01/20 0924    Visit Number [id number]    PT Start Time 0920    PT Stop Time 0955    PT Time Calculation (min) 35 min    Activity Tolerance Patient tolerated treatment well    Behavior During Therapy [facility] for tasks assessed/performed           Past Medical History:  Diagnosis Date  . 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 ABDOMINAL AORTAGRAM N/A 06/14/2014   Procedure: ABDOMINAL Maxcine Ham;  Surgeon: Conrad Vancouver, MD;  Location: [facility] CATH LAB;  Service: Cardiovascular;  Laterality: N/A;  . cheek surgery    . TIBIA IM NAIL INSERTION Left 02/01/2020   Procedure: INTRAMEDULLARY (IM) NAIL TIBIAL;  Surgeon: Hiram Gash, MD;  Location: [facility];  Service: Orthopedics;  Laterality: Left;    There were no vitals filed for this visit.   Subjective Assessment - 08/01/20 0843    Subjective I am feeling better.  Therapy is helping me.  Pt  is having difficulty walking.              [facility] PT Assessment - 08/01/20 0001      AROM   Left Ankle Dorsiflexion 10    Left Ankle Plantar Flexion 50    Left Ankle Inversion 25    Left Ankle Eversion 25      Strength   Left Ankle Dorsiflexion 5/5    Left Ankle Plantar Flexion 4+/5    Left Ankle Inversion 5/5    Left Ankle Eversion 5/5                [facility] Adult PT Treatment/Exercise - 08/01/20 0001      Ambulation/Gait   Ambulation Distance (Feet) 250 Feet      Self-Care   Self-Care Other Self-Care Comments    Other Self-Care Comments  functional limitations, LE alignment, Discharge and HEP      Knee/Hip Exercises: Stretches   Gastroc Stretch 3  reps;30 seconds      Knee/Hip Exercises: Standing   Heel Raises Both;20 reps    Forward Lunges Left;1 set;10 reps    Hip Abduction Stengthening;Both;1 set;10 reps    Abduction Limitations for ankle balance and stability    Functional Squat 2 sets    Functional Squat Limitations squat at chair    SLS 30 sec                    PT Short Term Goals - 08/01/20 0849      PT SHORT TERM GOAL #1   Title Independent with HEP    Status Achieved      PT SHORT TERM GOAL #2   Title Increase left ankle DF AROM at least 3-5 deg to improve toe clearance with gait    Status Achieved      PT SHORT TERM GOAL #3   Title Pt. to ambulate mod I with RW and CAM boot left LE with bilateral step-through gait with neutral left foot position    Status Achieved             PT  Long Term Goals - 08/01/20 0850      PT LONG TERM GOAL #2   Title Pt. to be able to perform community ambulation mod I with SPC vs. LRAD with left LE status for  boot vs. brace pending progression as allowed per MD at follow up visits    Status Achieved      PT LONG TERM GOAL #3   Title Pt. 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 He is stable with gait, single leg balance and strength is WFL.  He has tendency to toe out in standing but with gait is becomes more symmetrical.  Pain minimal and only when he walks > 30 min .  He is agreeable to DC from PT today.    PT Treatment/Interventions ADLs/Self Care Home Management;Cryotherapy;Moist Heat;Therapeutic exercise;Patient/family education;Manual techniques;Passive range of motion;Vasopneumatic Device;Functional mobility training;Gait training;Stair training;Therapeutic activities;Neuromuscular re-education;Balance  training;Taping    PT Next Visit Plan DC    PT Home Exercise Plan Access code: [email or web address]    Consulted and Agree with Plan of Care Patient           Patient will benefit from skilled therapeutic intervention in order to improve the following deficits and impairments:  Pain,Impaired flexibility,Decreased strength,Decreased activity tolerance,Increased edema,Decreased range of motion,Abnormal gait,Decreased balance,Difficulty walking,Hypomobility  Visit Diagnosis: Pain in left lower leg  Stiffness of left ankle, not elsewhere classified  Difficulty in walking, not elsewhere classified  Localized edema  Closed fracture of shaft of left tibia with routine healing, unspecified fracture morphology, subsequent encounter     Problem List Patient Active Problem List   Diagnosis Date Noted  . Closed fracture of left tibia 02/01/2020  . Raynaud's phenomenon 11/05/2014  . Toe pain, bilateral 09/04/2014  . DOE (dyspnea on exertion) 09/04/2014  . Chronic venous insufficiency 05/18/2014  . 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  Patient goals were met. Patient is being discharged due to meeting the stated rehab goals.  ?????    Raeford Razor, PT 08/01/20 9:27 AM Phone: [phone] Fax: [phone]

## 2020-08-06 ENCOUNTER — Ambulatory Visit: Payer: Medicaid Other | Admitting: Physical Therapy
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
